# Patient Record
Sex: Male | Born: 1950 | Race: White | Hispanic: No | Marital: Married | Smoking: Former smoker
Health system: Southern US, Community
[De-identification: ages and names within clinical notes are randomized; demographics above are authoritative.]

## PROBLEM LIST (undated history)

## (undated) DIAGNOSIS — N419 Inflammatory disease of prostate, unspecified: Secondary | ICD-10-CM

## (undated) DIAGNOSIS — E119 Type 2 diabetes mellitus without complications: Secondary | ICD-10-CM

## (undated) DIAGNOSIS — I10 Essential (primary) hypertension: Secondary | ICD-10-CM

## (undated) DIAGNOSIS — I350 Nonrheumatic aortic (valve) stenosis: Secondary | ICD-10-CM

## (undated) DIAGNOSIS — K589 Irritable bowel syndrome without diarrhea: Secondary | ICD-10-CM

## (undated) DIAGNOSIS — E871 Hypo-osmolality and hyponatremia: Secondary | ICD-10-CM

## (undated) DIAGNOSIS — I509 Heart failure, unspecified: Secondary | ICD-10-CM

## (undated) HISTORY — PX: CATARACT EXTRACTION: SUR2

## (undated) HISTORY — PX: CHOLECYSTECTOMY: SHX55

## (undated) HISTORY — PX: GALLBLADDER SURGERY: SHX652

---

## 2014-12-01 ENCOUNTER — Inpatient Hospital Stay (HOSPITAL_COMMUNITY)
Admission: EM | Admit: 2014-12-01 | Discharge: 2014-12-03 | DRG: 392 | Disposition: A | Discharge status: Home / Self Care | Payer: Medicare Other | Attending: Internal Medicine | Admitting: Internal Medicine

## 2014-12-01 ENCOUNTER — Emergency Department (HOSPITAL_COMMUNITY): Payer: Medicare Other

## 2014-12-01 ENCOUNTER — Encounter (HOSPITAL_COMMUNITY): Payer: Self-pay | Admitting: Emergency Medicine

## 2014-12-01 DIAGNOSIS — Z8719 Personal history of other diseases of the digestive system: Secondary | ICD-10-CM

## 2014-12-01 DIAGNOSIS — Z9049 Acquired absence of other specified parts of digestive tract: Secondary | ICD-10-CM

## 2014-12-01 DIAGNOSIS — A084 Viral intestinal infection, unspecified: Principal | ICD-10-CM | POA: Diagnosis present

## 2014-12-01 DIAGNOSIS — R112 Nausea with vomiting, unspecified: Secondary | ICD-10-CM | POA: Diagnosis present

## 2014-12-01 DIAGNOSIS — K219 Gastro-esophageal reflux disease without esophagitis: Secondary | ICD-10-CM | POA: Diagnosis present

## 2014-12-01 DIAGNOSIS — E878 Other disorders of electrolyte and fluid balance, not elsewhere classified: Secondary | ICD-10-CM | POA: Diagnosis present

## 2014-12-01 DIAGNOSIS — R197 Diarrhea, unspecified: Secondary | ICD-10-CM

## 2014-12-01 DIAGNOSIS — Z6841 Body Mass Index (BMI) 40.0 and over, adult: Secondary | ICD-10-CM

## 2014-12-01 DIAGNOSIS — E871 Hypo-osmolality and hyponatremia: Secondary | ICD-10-CM | POA: Diagnosis present

## 2014-12-01 DIAGNOSIS — K439 Ventral hernia without obstruction or gangrene: Secondary | ICD-10-CM | POA: Diagnosis present

## 2014-12-01 DIAGNOSIS — Z23 Encounter for immunization: Secondary | ICD-10-CM

## 2014-12-01 DIAGNOSIS — Z833 Family history of diabetes mellitus: Secondary | ICD-10-CM

## 2014-12-01 DIAGNOSIS — I509 Heart failure, unspecified: Secondary | ICD-10-CM

## 2014-12-01 DIAGNOSIS — K589 Irritable bowel syndrome without diarrhea: Secondary | ICD-10-CM | POA: Diagnosis present

## 2014-12-01 DIAGNOSIS — I350 Nonrheumatic aortic (valve) stenosis: Secondary | ICD-10-CM

## 2014-12-01 DIAGNOSIS — Z87891 Personal history of nicotine dependence: Secondary | ICD-10-CM

## 2014-12-01 DIAGNOSIS — R109 Unspecified abdominal pain: Secondary | ICD-10-CM | POA: Diagnosis present

## 2014-12-01 DIAGNOSIS — I5022 Chronic systolic (congestive) heart failure: Secondary | ICD-10-CM | POA: Diagnosis present

## 2014-12-01 DIAGNOSIS — Z79899 Other long term (current) drug therapy: Secondary | ICD-10-CM

## 2014-12-01 DIAGNOSIS — E119 Type 2 diabetes mellitus without complications: Secondary | ICD-10-CM | POA: Diagnosis present

## 2014-12-01 DIAGNOSIS — I2510 Atherosclerotic heart disease of native coronary artery without angina pectoris: Secondary | ICD-10-CM | POA: Diagnosis present

## 2014-12-01 DIAGNOSIS — Z8249 Family history of ischemic heart disease and other diseases of the circulatory system: Secondary | ICD-10-CM

## 2014-12-01 DIAGNOSIS — Z7982 Long term (current) use of aspirin: Secondary | ICD-10-CM

## 2014-12-01 HISTORY — DX: Heart failure, unspecified: I50.9

## 2014-12-01 HISTORY — DX: Nonrheumatic aortic (valve) stenosis: I35.0

## 2014-12-01 HISTORY — DX: Irritable bowel syndrome, unspecified: K58.9

## 2014-12-01 HISTORY — DX: Type 2 diabetes mellitus without complications: E11.9

## 2014-12-01 LAB — COMPREHENSIVE METABOLIC PANEL
ALBUMIN: 3.4 g/dL — AB (ref 3.5–5.0)
ALT: 17 U/L (ref 17–63)
AST: 18 U/L (ref 15–41)
Alkaline Phosphatase: 88 U/L (ref 38–126)
Anion gap: 6 (ref 5–15)
BUN: 13 mg/dL (ref 6–20)
CHLORIDE: 92 mmol/L — AB (ref 101–111)
CO2: 23 mmol/L (ref 22–32)
Calcium: 9.1 mg/dL (ref 8.9–10.3)
Creatinine, Ser: 0.73 mg/dL (ref 0.61–1.24)
GFR calc Af Amer: >60 mL/min (ref >60)
GLUCOSE: 160 mg/dL — AB (ref 65–99)
POTASSIUM: 4.5 mmol/L (ref 3.5–5.1)
SODIUM: 121 mmol/L — AB (ref 135–145)
Total Bilirubin: 0.6 mg/dL (ref 0.3–1.2)
Total Protein: 7.1 g/dL (ref 6.5–8.1)

## 2014-12-01 LAB — CBC
HEMATOCRIT: 34.4 % — AB (ref 39.0–52.0)
Hemoglobin: 12.4 g/dL — ABNORMAL LOW (ref 13.0–17.0)
MCH: 30.2 pg (ref 26.0–34.0)
MCHC: 36 g/dL (ref 30.0–36.0)
MCV: 83.9 fL (ref 78.0–100.0)
Platelets: 210 10*3/uL (ref 150–400)
RBC: 4.1 MIL/uL — ABNORMAL LOW (ref 4.22–5.81)
RDW: 12.8 % (ref 11.5–15.5)
WBC: 8.4 10*3/uL (ref 4.0–10.5)

## 2014-12-01 LAB — LIPASE, BLOOD: LIPASE: 27 U/L (ref 11–51)

## 2014-12-01 MED ORDER — IOHEXOL 300 MG/ML  SOLN
25.0000 mL | Freq: Once | INTRAMUSCULAR | Status: AC | PRN
  Administered 2014-12-01: 25 mL via ORAL

## 2014-12-01 MED ORDER — ONDANSETRON 4 MG PO TBDP
4.0000 mg | ORAL_TABLET | Freq: Once | ORAL | Status: AC | PRN
  Administered 2014-12-01: 4 mg via ORAL
  Filled 2014-12-01: qty 1

## 2014-12-01 MED ORDER — SODIUM CHLORIDE 0.9 % IV BOLUS (SEPSIS)
500.0000 mL | Freq: Once | INTRAVENOUS | Status: AC
  Administered 2014-12-02: 500 mL via INTRAVENOUS

## 2014-12-01 NOTE — ED Provider Notes (Signed)
CSN: [id number]     Arrival date & time 12/01/14  1949 History   First MD Initiated Contact with Patient 12/01/14 2257     Chief Complaint  Patient presents with  . Abdominal Pain    HPI   Kyle Barr is a 64 y.o. male with a PMH of IBS, DM, heart failure, aortic stenosis who presents to the ED with abdominal pain. He states he has had abdominal pain for the past 2 months. He reports frequent stools for the last 5 days, and states he had an episode of diarrhea today. He denies hematochezia or melena. He also reports nausea and vomiting for the last 2 days. He states he had one episode of emesis yesterday and 2 episodes of emesis today. He denies hematemesis. He is unable to identify anything that precipitates his symptoms. He has not tried anything for symptom relief. He denies fever, chills, headache, lightheadedness, dizziness, chest pain, shortness of breath, dysuria, urgency, frequency.   Past Medical History  Diagnosis Date  . IBS (irritable bowel syndrome)     with constipation  . Diabetes mellitus without complication (HCC)   . Heart failure (HCC)   . Aortic stenosis    Past Surgical History  Procedure Laterality Date  . Cataract extraction    . Gallbladder surgery     History reviewed. No pertinent family history. Social History  Substance Use Topics  . Smoking status: Former Games developermoker  . Smokeless tobacco: Never Used  . Alcohol Use: No    Review of Systems  Constitutional: Positive for fatigue. Negative for fever and chills.  Respiratory: Negative for shortness of breath.   Cardiovascular: Negative for chest pain.  Gastrointestinal: Positive for nausea, vomiting, abdominal pain and diarrhea. Negative for constipation and blood in stool.  Genitourinary: Negative for dysuria, urgency and frequency.  Neurological: Positive for weakness. Negative for dizziness, syncope, light-headedness, numbness and headaches.       Reports generalized weakness.  All other systems  reviewed and are negative.     Allergies  Review of patient's allergies indicates no known allergies.  Home Medications   Prior to Admission medications   Medication Sig Start Date End Date Taking? Authorizing Provider  TOUJEO SOLOSTAR 300 UNIT/ML SOPN Inject 20 Units as directed at bedtime. 10/19/14   Historical Provider, MD  TRULICITY 0.75 MG/0.5ML SOPN Inject 0.75 mLs as directed every 7 (seven) days. 09/08/14   Historical Provider, MD    BP 151/77 mmHg  Pulse 87  Temp(Src) 97.6 F (36.4 C) (Oral)  Resp 20  Ht 5\' 10"  (1.778 m)  Wt 290 lb (131.543 kg)  BMI 41.61 kg/m2  SpO2 99% Physical Exam  Constitutional: He is oriented to person, place, and time. No distress.  Obese male in no acute distress.  HENT:  Head: Normocephalic and atraumatic.  Right Ear: External ear normal.  Left Ear: External ear normal.  Nose: Nose normal.  Mouth/Throat: Uvula is midline, oropharynx is clear and moist and mucous membranes are normal.  Eyes: Conjunctivae, EOM and lids are normal. Pupils are equal, round, and reactive to light. Right eye exhibits no discharge. Left eye exhibits no discharge. No scleral icterus.  Neck: Normal range of motion. Neck supple.  Cardiovascular: Normal rate, regular rhythm, intact distal pulses and normal pulses.   3/6 systolic murmur loudest RUSB.  Pulmonary/Chest: Effort normal and breath sounds normal. No respiratory distress. He has no wheezes. He has no rales.  Abdominal: Soft. Normal appearance and bowel sounds are normal. He exhibits  no distension and no mass. There is tenderness. There is no rigidity, no rebound and no guarding.  Tenderness to palpation left lower quadrant. No rebound, guarding, or palpable masses.  Musculoskeletal: Normal range of motion. He exhibits no edema or tenderness.  Neurological: He is alert and oriented to person, place, and time.  Skin: Skin is warm, dry and intact. No rash noted. He is not diaphoretic. No erythema. No pallor.   Psychiatric: He has a normal mood and affect. His speech is normal and behavior is normal.  Nursing note and vitals reviewed.   ED Course  Procedures (including critical care time)  Labs Review Labs Reviewed  COMPREHENSIVE METABOLIC PANEL - Abnormal; Notable for the following:    Sodium 121 (*)    Chloride 92 (*)    Glucose, Bld 160 (*)    Albumin 3.4 (*)    All other components within normal limits  CBC - Abnormal; Notable for the following:    RBC 4.10 (*)    Hemoglobin 12.4 (*)    HCT 34.4 (*)    All other components within normal limits  URINALYSIS, ROUTINE W REFLEX MICROSCOPIC (NOT AT [facility]) - Abnormal; Notable for the following:    Hgb urine dipstick MODERATE (*)    Protein, ur 100 (*)    All other components within normal limits  LIPASE, BLOOD  URINE MICROSCOPIC-ADD ON    Imaging Review Ct Abdomen Pelvis W Contrast  12/02/2014  CLINICAL DATA:  Chronic generalized abdominal pain for 2 months, with nausea, vomiting and diarrhea. Microhematuria. Initial encounter. EXAM: CT ABDOMEN AND PELVIS WITH CONTRAST TECHNIQUE: Multidetector CT imaging of the abdomen and pelvis was performed using the standard protocol following bolus administration of intravenous contrast. CONTRAST:  OMNIPAQUE IOHEXOL 300 MG/ML  SOLN COMPARISON:  None. FINDINGS: The visualized lung bases are clear. Scattered coronary artery calcification is noted. The liver and spleen are unremarkable in appearance. The patient is status post cholecystectomy, with clips noted at the gallbladder fossa. The pancreas and adrenal glands are unremarkable. The kidneys are unremarkable in appearance. There is no evidence of hydronephrosis. No renal or ureteral stones are seen. Mild nonspecific perinephric stranding is noted bilaterally. No free fluid is identified. The small bowel is unremarkable in appearance. The stomach is within normal limits. No acute vascular abnormalities are seen. Scattered calcification is noted  along the abdominal aorta and its branches. A small midline anterior abdominal wall hernia is noted, superior to the umbilicus, containing only fat. The appendix is normal in caliber, without evidence for appendicitis. The colon is unremarkable in appearance. The bladder is moderately distended and grossly unremarkable. The prostate remains borderline normal in size. No inguinal lymphadenopathy is seen. No acute osseous abnormalities are identified. IMPRESSION: 1. No acute abnormality seen to explain the patient's symptoms. 2. Small midline anterior abdominal wall hernia, superior to the umbilicus, containing only fat. 3. Scattered calcification along the abdominal aorta and its branches. 4. Scattered coronary artery calcification noted. Electronically Signed   By: Roanna Raider M.D.   On: 12/02/2014 01:09     I have personally reviewed and evaluated these images and lab results as part of my medical decision-making.   EKG Interpretation None      MDM   Final diagnoses:  Abdominal pain  Non-intractable vomiting with nausea, vomiting of unspecified type  Diarrhea, unspecified type  Hyponatremia    64 year old male presents with abdominal pain, nausea, vomiting, diarrhea. He states his abdominal pain has been present for the  last 2 months. He reports frequent stools over the last 5 days, and diarrhea today. He reports nausea and vomiting for the past 2 days. He denies fever, chills, headache, lightheadedness, dizziness, chest pain, shortness of breath, dysuria, urgency, frequency, hematemesis, hematochezia, melena. He states he is from [place] and is in town on work.  Patient is afebrile. Vital signs stable. Heart regular rate and rhythm. Lungs clear to auscultation bilaterally. Abdomen soft, non-distended. Mild tenderness palpation in left lower quadrant. No rebound, guarding, or palpable masses. No lower extremity edema.  CBC negative for leukocytosis. Hemoglobin 12.4. CMP with sodium 121,  chloride 92. Lipase within normal limits. UA negative for infection. Given 500 cc NS. Patient states his sodium typically runs low, however he is unsure how low.   Will obtain CT abdomen pelvis. CT abdomen pelvis negative for acute abnormality to explain abdominal pain.  Given hyponatremia and hypochloremia, will consult hospitalist for admission. Spoke with Dr. Clyde Lundborg; patient to be admitted for further evaluation and management.   BP 155/77 mmHg  Pulse 83  Temp(Src) 97.6 F (36.4 C) (Oral)  Resp 14  Ht  (1.778 m)  Wt 290 lb (131.543 kg)  BMI 41.61 kg/m2  SpO2 99%      Mady Gemma, PA-C 12/02/14 0149  Loren Racer, MD 12/03/14 [phone]

## 2014-12-01 NOTE — ED Notes (Signed)
Patient is having abdominal pain. Abdominal pain has been for 2 months. The vomiting and diarrhea for 5 days. [place] on [place]. Patient is having muscle pain all over. Patient can not hold anything down. Patient has a history of IBS.

## 2014-12-02 ENCOUNTER — Encounter (HOSPITAL_COMMUNITY): Payer: Self-pay

## 2014-12-02 DIAGNOSIS — R109 Unspecified abdominal pain: Secondary | ICD-10-CM | POA: Diagnosis present

## 2014-12-02 DIAGNOSIS — R112 Nausea with vomiting, unspecified: Secondary | ICD-10-CM | POA: Diagnosis present

## 2014-12-02 DIAGNOSIS — Z7982 Long term (current) use of aspirin: Secondary | ICD-10-CM | POA: Diagnosis not present

## 2014-12-02 DIAGNOSIS — K219 Gastro-esophageal reflux disease without esophagitis: Secondary | ICD-10-CM | POA: Diagnosis present

## 2014-12-02 DIAGNOSIS — R197 Diarrhea, unspecified: Secondary | ICD-10-CM | POA: Diagnosis not present

## 2014-12-02 DIAGNOSIS — E871 Hypo-osmolality and hyponatremia: Secondary | ICD-10-CM | POA: Diagnosis present

## 2014-12-02 DIAGNOSIS — A084 Viral intestinal infection, unspecified: Secondary | ICD-10-CM | POA: Diagnosis present

## 2014-12-02 DIAGNOSIS — E119 Type 2 diabetes mellitus without complications: Secondary | ICD-10-CM

## 2014-12-02 DIAGNOSIS — K589 Irritable bowel syndrome without diarrhea: Secondary | ICD-10-CM | POA: Diagnosis present

## 2014-12-02 DIAGNOSIS — Z23 Encounter for immunization: Secondary | ICD-10-CM | POA: Diagnosis not present

## 2014-12-02 DIAGNOSIS — I509 Heart failure, unspecified: Secondary | ICD-10-CM

## 2014-12-02 DIAGNOSIS — I350 Nonrheumatic aortic (valve) stenosis: Secondary | ICD-10-CM | POA: Diagnosis present

## 2014-12-02 DIAGNOSIS — Z8249 Family history of ischemic heart disease and other diseases of the circulatory system: Secondary | ICD-10-CM | POA: Diagnosis not present

## 2014-12-02 DIAGNOSIS — E878 Other disorders of electrolyte and fluid balance, not elsewhere classified: Secondary | ICD-10-CM | POA: Diagnosis present

## 2014-12-02 DIAGNOSIS — Z833 Family history of diabetes mellitus: Secondary | ICD-10-CM | POA: Diagnosis not present

## 2014-12-02 DIAGNOSIS — K439 Ventral hernia without obstruction or gangrene: Secondary | ICD-10-CM | POA: Diagnosis present

## 2014-12-02 DIAGNOSIS — Z79899 Other long term (current) drug therapy: Secondary | ICD-10-CM | POA: Diagnosis not present

## 2014-12-02 DIAGNOSIS — Z87891 Personal history of nicotine dependence: Secondary | ICD-10-CM | POA: Diagnosis not present

## 2014-12-02 DIAGNOSIS — Z6841 Body Mass Index (BMI) 40.0 and over, adult: Secondary | ICD-10-CM | POA: Diagnosis not present

## 2014-12-02 DIAGNOSIS — I5022 Chronic systolic (congestive) heart failure: Secondary | ICD-10-CM | POA: Diagnosis present

## 2014-12-02 DIAGNOSIS — Z8719 Personal history of other diseases of the digestive system: Secondary | ICD-10-CM | POA: Diagnosis not present

## 2014-12-02 DIAGNOSIS — I2510 Atherosclerotic heart disease of native coronary artery without angina pectoris: Secondary | ICD-10-CM | POA: Diagnosis present

## 2014-12-02 DIAGNOSIS — Z9049 Acquired absence of other specified parts of digestive tract: Secondary | ICD-10-CM | POA: Diagnosis not present

## 2014-12-02 DIAGNOSIS — R1032 Left lower quadrant pain: Secondary | ICD-10-CM | POA: Diagnosis not present

## 2014-12-02 LAB — BASIC METABOLIC PANEL
Anion gap: 7 (ref 5–15)
BUN: 10 mg/dL (ref 6–20)
CALCIUM: 9 mg/dL (ref 8.9–10.3)
CO2: 24 mmol/L (ref 22–32)
CREATININE: 0.73 mg/dL (ref 0.61–1.24)
Chloride: 94 mmol/L — ABNORMAL LOW (ref 101–111)
GFR calc Af Amer: >60 mL/min (ref >60)
GLUCOSE: 143 mg/dL — AB (ref 65–99)
Potassium: 4.2 mmol/L (ref 3.5–5.1)
SODIUM: 125 mmol/L — AB (ref 135–145)

## 2014-12-02 LAB — URINE MICROSCOPIC-ADD ON

## 2014-12-02 LAB — CBC
HCT: 34 % — ABNORMAL LOW (ref 39.0–52.0)
HEMOGLOBIN: 12.1 g/dL — AB (ref 13.0–17.0)
MCH: 30.3 pg (ref 26.0–34.0)
MCHC: 35.6 g/dL (ref 30.0–36.0)
MCV: 85 fL (ref 78.0–100.0)
PLATELETS: 211 10*3/uL (ref 150–400)
RBC: 4 MIL/uL — AB (ref 4.22–5.81)
RDW: 12.7 % (ref 11.5–15.5)
WBC: 6.4 10*3/uL (ref 4.0–10.5)

## 2014-12-02 LAB — SODIUM, URINE, RANDOM: Sodium, Ur: 13 mmol/L

## 2014-12-02 LAB — GLUCOSE, CAPILLARY
Glucose-Capillary: 194 mg/dL — ABNORMAL HIGH (ref 65–99)
Glucose-Capillary: 333 mg/dL — ABNORMAL HIGH (ref 65–99)
Glucose-Capillary: 246 mg/dL — ABNORMAL HIGH (ref 65–99)
Glucose-Capillary: 319 mg/dL — ABNORMAL HIGH (ref 65–99)

## 2014-12-02 LAB — URINALYSIS, ROUTINE W REFLEX MICROSCOPIC
Bilirubin Urine: NEGATIVE
GLUCOSE, UA: NEGATIVE mg/dL
KETONES UR: NEGATIVE mg/dL
LEUKOCYTES UA: NEGATIVE
NITRITE: NEGATIVE
PROTEIN: 100 mg/dL — AB
Specific Gravity, Urine: 1.011 (ref 1.005–1.030)
Urobilinogen, UA: 0.2 mg/dL (ref 0.0–1.0)
pH: 5.5 (ref 5.0–8.0)

## 2014-12-02 LAB — C DIFFICILE QUICK SCREEN W PCR REFLEX
C DIFFICILE (CDIFF) TOXIN: NEGATIVE
C DIFFICLE (CDIFF) ANTIGEN: NEGATIVE
C Diff interpretation: NEGATIVE

## 2014-12-02 LAB — BRAIN NATRIURETIC PEPTIDE: B Natriuretic Peptide: 379.7 pg/mL — ABNORMAL HIGH (ref 0.0–100.0)

## 2014-12-02 LAB — TSH: TSH: 1.99 u[IU]/mL (ref 0.350–4.500)

## 2014-12-02 LAB — PROTIME-INR
INR: 1.02 (ref 0.00–1.49)
Prothrombin Time: 13.6 seconds (ref 11.6–15.2)

## 2014-12-02 LAB — OSMOLALITY, URINE: Osmolality, Ur: 118 mOsm/kg — ABNORMAL LOW (ref 390–1090)

## 2014-12-02 MED ORDER — INSULIN GLARGINE 300 UNIT/ML ~~LOC~~ SOPN: 20.0000 [IU] | PEN_INJECTOR | Freq: Every day | SUBCUTANEOUS | Status: DC

## 2014-12-02 MED ORDER — PNEUMOCOCCAL VAC POLYVALENT 25 MCG/0.5ML IJ INJ
0.5000 mL | INJECTION | INTRAMUSCULAR | Status: AC
  Administered 2014-12-03: 0.5 mL via INTRAMUSCULAR
  Filled 2014-12-02 (×2): qty 0.5

## 2014-12-02 MED ORDER — SIMETHICONE 80 MG PO CHEW
80.0000 mg | CHEWABLE_TABLET | Freq: Four times a day (QID) | ORAL | Status: DC | PRN
  Administered 2014-12-02 – 2014-12-03 (×2): 80 mg via ORAL
  Filled 2014-12-02 (×2): qty 1

## 2014-12-02 MED ORDER — FAMOTIDINE IN NACL 20-0.9 MG/50ML-% IV SOLN
20.0000 mg | Freq: Two times a day (BID) | INTRAVENOUS | Status: DC
  Administered 2014-12-02 (×2): 20 mg via INTRAVENOUS
  Filled 2014-12-02 (×2): qty 50

## 2014-12-02 MED ORDER — MORPHINE SULFATE (PF) 2 MG/ML IV SOLN
2.0000 mg | INTRAVENOUS | Status: DC | PRN
Start: 2014-12-02 — End: 2014-12-03

## 2014-12-02 MED ORDER — ALFUZOSIN HCL ER 10 MG PO TB24
10.0000 mg | ORAL_TABLET | Freq: Every day | ORAL | Status: DC
  Administered 2014-12-02: 10 mg via ORAL
  Filled 2014-12-02 (×2): qty 1

## 2014-12-02 MED ORDER — ENOXAPARIN SODIUM 80 MG/0.8ML ~~LOC~~ SOLN
65.0000 mg | SUBCUTANEOUS | Status: DC
  Administered 2014-12-02: 65 mg via SUBCUTANEOUS
  Filled 2014-12-02: qty 0.8

## 2014-12-02 MED ORDER — INSULIN GLARGINE 100 UNIT/ML ~~LOC~~ SOLN
20.0000 [IU] | Freq: Every day | SUBCUTANEOUS | Status: DC
  Administered 2014-12-02: 20 [IU] via SUBCUTANEOUS
  Filled 2014-12-02: qty 0.2

## 2014-12-02 MED ORDER — LINACLOTIDE 290 MCG PO CAPS
290.0000 ug | ORAL_CAPSULE | Freq: Every day | ORAL | Status: DC
  Filled 2014-12-02 (×2): qty 1

## 2014-12-02 MED ORDER — HYDRALAZINE HCL 20 MG/ML IJ SOLN: 5.0000 mg | INTRAMUSCULAR | Status: DC | PRN

## 2014-12-02 MED ORDER — FAMOTIDINE 20 MG PO TABS
20.0000 mg | ORAL_TABLET | Freq: Two times a day (BID) | ORAL | Status: DC
  Administered 2014-12-02 – 2014-12-03 (×2): 20 mg via ORAL
  Filled 2014-12-02 (×2): qty 1

## 2014-12-02 MED ORDER — CAMPHOR-MENTHOL 0.5-0.5 % EX LOTN
TOPICAL_LOTION | CUTANEOUS | Status: DC | PRN
  Administered 2014-12-02: 1 via TOPICAL
  Filled 2014-12-02: qty 222

## 2014-12-02 MED ORDER — HYDROXYZINE HCL 50 MG/ML IM SOLN
25.0000 mg | Freq: Four times a day (QID) | INTRAMUSCULAR | Status: DC | PRN
  Filled 2014-12-02 (×2): qty 0.5

## 2014-12-02 MED ORDER — CARVEDILOL 25 MG PO TABS
25.0000 mg | ORAL_TABLET | Freq: Two times a day (BID) | ORAL | Status: DC
  Administered 2014-12-02 (×2): 25 mg via ORAL
  Filled 2014-12-02 (×3): qty 1

## 2014-12-02 MED ORDER — ONDANSETRON 4 MG PO TBDP
4.0000 mg | ORAL_TABLET | Freq: Three times a day (TID) | ORAL | Status: DC | PRN
  Administered 2014-12-02: 4 mg via ORAL
  Filled 2014-12-02: qty 1

## 2014-12-02 MED ORDER — SODIUM CHLORIDE 0.9 % IV SOLN
INTRAVENOUS | Status: AC
  Administered 2014-12-02: 03:00:00 via INTRAVENOUS

## 2014-12-02 MED ORDER — INFLUENZA VAC SPLIT QUAD 0.5 ML IM SUSY
0.5000 mL | PREFILLED_SYRINGE | INTRAMUSCULAR | Status: AC
  Administered 2014-12-03: 0.5 mL via INTRAMUSCULAR
  Filled 2014-12-02 (×2): qty 0.5

## 2014-12-02 MED ORDER — IOHEXOL 300 MG/ML  SOLN
100.0000 mL | Freq: Once | INTRAMUSCULAR | Status: AC | PRN
  Administered 2014-12-02: 100 mL via INTRAVENOUS

## 2014-12-02 MED ORDER — ENOXAPARIN SODIUM 60 MG/0.6ML ~~LOC~~ SOLN
60.0000 mg | SUBCUTANEOUS | Status: DC
  Administered 2014-12-03: 60 mg via SUBCUTANEOUS
  Filled 2014-12-02: qty 0.6

## 2014-12-02 MED ORDER — NYSTATIN 100000 UNIT/GM EX POWD
Freq: Two times a day (BID) | CUTANEOUS | Status: DC
  Administered 2014-12-02 – 2014-12-03 (×2): via TOPICAL
  Filled 2014-12-02: qty 15

## 2014-12-02 MED ORDER — ASPIRIN EC 81 MG PO TBEC
81.0000 mg | DELAYED_RELEASE_TABLET | Freq: Every day | ORAL | Status: DC
  Administered 2014-12-02: 81 mg via ORAL
  Filled 2014-12-02 (×2): qty 1

## 2014-12-02 MED ORDER — SODIUM CHLORIDE 0.9 % IV SOLN
INTRAVENOUS | Status: DC
  Administered 2014-12-02: 17:00:00 via INTRAVENOUS

## 2014-12-02 MED ORDER — SODIUM CHLORIDE 0.9 % IJ SOLN
3.0000 mL | Freq: Two times a day (BID) | INTRAMUSCULAR | Status: DC
  Administered 2014-12-02 – 2014-12-03 (×4): 3 mL via INTRAVENOUS

## 2014-12-02 MED ORDER — INSULIN ASPART 100 UNIT/ML ~~LOC~~ SOLN
0.0000 [IU] | Freq: Three times a day (TID) | SUBCUTANEOUS | Status: DC
  Administered 2014-12-02: 2 [IU] via SUBCUTANEOUS
  Administered 2014-12-02: 3 [IU] via SUBCUTANEOUS
  Administered 2014-12-02: 7 [IU] via SUBCUTANEOUS
  Administered 2014-12-03: 5 [IU] via SUBCUTANEOUS

## 2014-12-02 NOTE — H&P (Addendum)
[facility] History and Physical  Kyle Barr ZOX:[id number] DOB: 11-26-1950 DOA: 12/01/2014  Referring physician: ED physician PCP: No primary care provider on file.  Specialists:   Chief Complaint: Nausea, vomiting, diarrhea, abdominal pain.  HPI: Kyle Barr is a 64 y.o. male with PMH of diabetes mellitus, GERD, IBS, aortic stenosis, systolic congestive heart failure (EF of 40%), BPH, who presents with nausea, vomiting, diarrhea, abdominal pain.  Patient reports that he has been having intermittent abdominal pain in the past 2 months. The pain located in the left lower quadrant, sometimes involving the whole left side. It is mild to moderate, sharp, nonradiating. It is not aggravated or alleviated any known factors. It is associated with nausea. Initially he did not have vomiting or diarrhea. In the past 2 or 3 days, he has been having vomiting and diarrhea. He vomited 1 to 3 times each day and had 4-5 bowel movements with loose stool today. Patient reports that he took antibiotics of ciprofloxacin and doxycycline for 6 weeks for neck skin infection at 2-3 months ago (he could not remember exactly how long ago). Patient has chronic shortness of breath and mild dry cough, which he attributes to congestive heart failure. Currently patient does not have chest pain, fever, chills, symptoms of UTI, unilateral weakness. Patient states that he did not take his Lasix and spironolactone in the past 3 days due to concerning for dehydration.  In ED, patient was found to have lipase 27, negative urinalysis, WBC 8.4, temperature normal, no tachycardia, sodium 121, potassium normal, renal function okay. CT-abdomen/pelvis that showed no acute abnormality seen to explain the patient's symptoms; small midline anterior abdominal wall hernia, superior to the umbilicus, containing only fat; scattered calcification along the abdominal aorta and its branches; scattered coronary artery calcification  noted.  Where does patient live?   At home (patient is from [place], on business here). Can patient participate in ADLs?  Yes     Review of Systems:   General: no fevers, chills, no changes in body weight, has poor appetite, has fatigue HEENT: no blurry vision, hearing changes or sore throat Pulm: has dyspnea, coughing, no wheezing CV: no chest pain, palpitations Abd: has nausea, vomiting, abdominal pain, diarrhea, no constipation GU: no dysuria, burning on urination, increased urinary frequency, hematuria  Ext: has leg edema Neuro: no unilateral weakness, numbness, or tingling, no vision change or hearing loss Skin: no rash MSK: No muscle spasm, no deformity, no limitation of range of movement in spin Heme: No easy bruising.  Travel history: No recent long distant travel.  Allergy: No Known Allergies  Past Medical History  Diagnosis Date  . IBS (irritable bowel syndrome)     with constipation  . Diabetes mellitus without complication (HCC)   . Heart failure (HCC)   . Aortic stenosis     Past Surgical History  Procedure Laterality Date  . Cataract extraction    . Gallbladder surgery      Social History:  reports that he has quit smoking. He has never used smokeless tobacco. He reports that he does not drink alcohol or use illicit drugs.  Family History:  Family History  Problem Relation Age of Onset  . Diabetes Mother   . Aortic stenosis Mother   . CAD Mother   . Congestive Heart Failure Father   . Diabetes Father      Prior to Admission medications   Medication Sig Start Date End Date Taking? Authorizing Provider  alfuzosin (UROXATRAL) 10 MG 24 hr tablet  Take 10 mg by mouth daily with breakfast.   Yes Historical Provider, MD  amoxicillin (AMOXIL) 875 MG tablet Take 875 mg by mouth 2 (two) times daily.   Yes Historical Provider, MD  aspirin EC 81 MG tablet Take 81 mg by mouth daily.   Yes Historical Provider, MD  carvedilol (COREG) 25 MG tablet Take 25 mg by  mouth 2 (two) times daily with a meal.   Yes Historical Provider, MD  dexlansoprazole (DEXILANT) 60 MG capsule Take 60 mg by mouth daily.   Yes Historical Provider, MD  furosemide (LASIX) 20 MG tablet Take 20 mg by mouth 2 (two) times daily.   Yes Historical Provider, MD  Linaclotide (LINZESS) 290 MCG CAPS capsule Take 290 mcg by mouth daily.   Yes Historical Provider, MD  metFORMIN (GLUCOPHAGE) 1000 MG tablet Take 1,000 mg by mouth 2 (two) times daily with a meal.   Yes Historical Provider, MD  omeprazole (PRILOSEC) 40 MG capsule Take 40 mg by mouth daily.   Yes Historical Provider, MD  repaglinide (PRANDIN) 2 MG tablet Take 2 mg by mouth 3 (three) times daily before meals.   Yes Historical Provider, MD  simethicone (MYLICON) 125 MG chewable tablet Chew 125 mg by mouth every 6 (six) hours as needed for flatulence.   Yes Historical Provider, MD  spironolactone (ALDACTONE) 25 MG tablet Take 25 mg by mouth daily.   Yes Historical Provider, MD  TOUJEO SOLOSTAR 300 UNIT/ML SOPN Inject 20 Units as directed at bedtime. 10/19/14  Yes Historical Provider, MD  TRULICITY 0.75 MG/0.5ML SOPN Inject 0.75 mg as directed every 7 (seven) days.  09/08/14  Yes Historical Provider, MD    Physical Exam: Filed Vitals:   12/01/14 2255 12/01/14 2350 12/02/14 0211 12/02/14 0232  BP: 151/77 155/77 130/67   Pulse: 87 83 87   Temp: 97.6 F (36.4 C)     TempSrc: Oral     Resp: 20 14 14    Height:    5\' 10"  (1.778 m)  Weight:    134.219 kg (295 lb 14.4 oz)  SpO2: 99% 99% 99%    General: Not in acute distress. Dry mucus and membrane. HEENT:       Eyes: PERRL, EOMI, no scleral icterus.       ENT: No discharge from the ears and nose, no pharynx injection, no tonsillar enlargement.        Neck: No JVD, no bruit, no mass felt. Heme: No neck lymph node enlargement. Cardiac: S1/S2, RRR, 2/6 systolic murmurs, No gallops or rubs. Pulm: No rales, wheezing, rhonchi or rubs. Abd: Soft, nondistended, tenderness diffusely, worse  on the LLQ, no rebound pain, no organomegaly, BS present. Ext: Trace pitting leg edema bilaterally. 2+DP/PT pulse bilaterally. Musculoskeletal: No joint deformities, No joint redness or warmth, no limitation of ROM in spin. Skin: No rashes.  Neuro: Alert, oriented X3, cranial nerves II-XII grossly intact, muscle strength 5/5 in all extremities, sensation to light touch intact.  Psych: Patient is not psychotic, no suicidal or hemocidal ideation.  Labs on Admission:  Basic Metabolic Panel:  Recent Labs Lab 12/01/14 2109  NA 121*  K 4.5  CL 92*  CO2 23  GLUCOSE 160*  BUN 13  CREATININE 0.73  CALCIUM 9.1   Liver Function Tests:  Recent Labs Lab 12/01/14 2109  AST 18  ALT 17  ALKPHOS 88  BILITOT 0.6  PROT 7.1  ALBUMIN 3.4*    Recent Labs Lab 12/01/14 2109  LIPASE 27   No  results for input(s): AMMONIA in the last 168 hours. CBC:  Recent Labs Lab 12/01/14 2109  WBC 8.4  HGB 12.4*  HCT 34.4*  MCV 83.9  PLT 210   Cardiac Enzymes: No results for input(s): CKTOTAL, CKMB, CKMBINDEX, TROPONINI in the last 168 hours.  BNP (last 3 results) No results for input(s): BNP in the last 8760 hours.  ProBNP (last 3 results) No results for input(s): PROBNP in the last 8760 hours.  CBG: No results for input(s): GLUCAP in the last 168 hours.  Radiological Exams on Admission: Ct Abdomen Pelvis W Contrast  12/02/2014  CLINICAL DATA:  Chronic generalized abdominal pain for 2 months, with nausea, vomiting and diarrhea. Microhematuria. Initial encounter. EXAM: CT ABDOMEN AND PELVIS WITH CONTRAST TECHNIQUE: Multidetector CT imaging of the abdomen and pelvis was performed using the standard protocol following bolus administration of intravenous contrast. CONTRAST:  OMNIPAQUE IOHEXOL 300 MG/ML  SOLN COMPARISON:  None. FINDINGS: The visualized lung bases are clear. Scattered coronary artery calcification is noted. The liver and spleen are unremarkable in appearance. The patient  is status post cholecystectomy, with clips noted at the gallbladder fossa. The pancreas and adrenal glands are unremarkable. The kidneys are unremarkable in appearance. There is no evidence of hydronephrosis. No renal or ureteral stones are seen. Mild nonspecific perinephric stranding is noted bilaterally. No free fluid is identified. The small bowel is unremarkable in appearance. The stomach is within normal limits. No acute vascular abnormalities are seen. Scattered calcification is noted along the abdominal aorta and its branches. A small midline anterior abdominal wall hernia is noted, superior to the umbilicus, containing only fat. The appendix is normal in caliber, without evidence for appendicitis. The colon is unremarkable in appearance. The bladder is moderately distended and grossly unremarkable. The prostate remains borderline normal in size. No inguinal lymphadenopathy is seen. No acute osseous abnormalities are identified. IMPRESSION: 1. No acute abnormality seen to explain the patient's symptoms. 2. Small midline anterior abdominal wall hernia, superior to the umbilicus, containing only fat. 3. Scattered calcification along the abdominal aorta and its branches. 4. Scattered coronary artery calcification noted. Electronically Signed   By: Roanna Raider M.D.   On: 12/02/2014 01:09    EKG: Independently reviewed.  Abnormal findings:  QTC 477, bifascicular block (no old EKG on record)  Assessment/Plan Principal Problem:   Nausea vomiting and diarrhea Active Problems:   Diabetes mellitus without complication (HCC)   Heart failure (HCC)   Aortic stenosis   Hyponatremia   Abdominal pain   Chronic systolic heart failure (HCC)   GERD (gastroesophageal reflux disease)   Nausea vomiting and diarrhea and AP: Etiology is not clear. Lipase negative. CT-abdomen/pelvis is not impressive. He has a history of IBS, which may have partially contributed. Since patient used antibiotics for 6 weeks in the  past 2-3 month, need to r/o C. difficile colitis. Patient is not septic on admission. Hemodynamically stable.  -will admit to tele bed -When necessary Hydroxyzine for nausea, morphine for pain -Gentle IV fluid -check C diff pcr and stool culture  GERD: -change PPI to Pepcid until C. difficile PCR negative  DM-II: Last A1c is not on record. Patient is taking Toujeo, Prandin, metformin and Trulicity at home -Continue Toujeo, 20 units daily   -SSI -Check A1c  Chronic systolic congestive heart failure: Per patient he had TEE last month, which showed EF of 40%. Due to nausea, vomiting and diarrhea, patient has not taken Lasix in the The past 3 days. Patient has  trace amount of leg edema, no signs of acute exacerbation of CHF. -Hold diuretics -Check BNP  Aortic stenosis: Patient states that he had TEE for the evaluation of aortic stenosis, planning to do aortic valve replacement in the near future.  Hyponatremia: Sodium 121. Not sure how acutely this has happened, but is likely chronic issue due to CHF and use of diuretics, which has worsened by nausea, vomiting and diarrhea. Since patient's mental status is normal, I will not treat this too agressively. -Received NS 500cc in ED. -will give NS 75 cc/h x 6 hour -repeat BMP in AM -check plasma and urine Osmo and urine sodium level -check TSH   DVT ppx: SQ Lovenox  Code Status: Full code Family Communication: None at bed side. Disposition Plan: Admit to inpatient   Date of Service 12/02/2014    Lorretta Harp [facility] Pager [phone]  If 7PM-7AM, please contact night-coverage [email or web address] Password [email or web address] 12/02/2014, 2:56 AM

## 2014-12-02 NOTE — ED Notes (Signed)
MD at bedside. 

## 2014-12-02 NOTE — Progress Notes (Signed)
Pt admitted after midnight for evaluation of diarrhea. Improving, please see earlier note by Dr. Clyde LundborgNiu. Pt wants to go home but stool studies still pending and Na level still on low side. Continue IVF, repeat BMP in AM. Possible d/c in AM.  Debbora PrestoMAGICK-Kenidee Cregan, MD  [facility] Pager [phone]  If 7PM-7AM, please contact night-coverage [email or web address] Password [email or web address]

## 2014-12-02 NOTE — Progress Notes (Signed)
Pt had 22 beat of what appears to be SV Tach on the tele monitor. Text paged Dr. Izola PriceMyers informing her of same.  Delford FieldGagliano, Selah Zelman E, RN

## 2014-12-02 NOTE — Progress Notes (Signed)
OT Cancellation Note  Patient Details Name: Renato BattlesManuel Slone MRN: [id number] DOB: 08/07/1950   Cancelled Treatment:    Reason Eval/Treat Not Completed: OT screened, no needs identified, will sign off.  Angelene GiovanniConarpe, Corwin Kuiken M  Arslan Kier Brysononarpe, OTR/L [phone]  12/02/2014, 10:39 AM

## 2014-12-02 NOTE — Progress Notes (Signed)
PT Cancellation Note  Patient Details Name: Kyle Barr MRN: [id number] DOB: 05/29/1950   Cancelled Treatment:    Reason Eval/Treat Not Completed: PT screened, no needs identified, will sign off;OT screened, no needs identified, will sign off Pt denies any therapy needs at this time.  Pt just up to bathroom with RN who reports steady gait, no issues observed.  Pt reports feeling better today and denies any needs.   Dariona Postma,KATHrine E 12/02/2014, 9:31 AM Zenovia JarredKati Atiana Levier, PT, DPT 12/02/2014 Pager: [phone]

## 2014-12-03 DIAGNOSIS — A084 Viral intestinal infection, unspecified: Secondary | ICD-10-CM | POA: Diagnosis not present

## 2014-12-03 DIAGNOSIS — I509 Heart failure, unspecified: Secondary | ICD-10-CM | POA: Insufficient documentation

## 2014-12-03 LAB — CBC
HCT: 33.2 % — ABNORMAL LOW (ref 39.0–52.0)
HEMOGLOBIN: 11.5 g/dL — AB (ref 13.0–17.0)
MCH: 30 pg (ref 26.0–34.0)
MCHC: 34.6 g/dL (ref 30.0–36.0)
MCV: 86.7 fL (ref 78.0–100.0)
PLATELETS: 197 10*3/uL (ref 150–400)
RBC: 3.83 MIL/uL — ABNORMAL LOW (ref 4.22–5.81)
RDW: 13.1 % (ref 11.5–15.5)
WBC: 5.2 10*3/uL (ref 4.0–10.5)

## 2014-12-03 LAB — BASIC METABOLIC PANEL
Anion gap: 6 (ref 5–15)
BUN: 12 mg/dL (ref 6–20)
CALCIUM: 9 mg/dL (ref 8.9–10.3)
CO2: 24 mmol/L (ref 22–32)
CREATININE: 1.05 mg/dL (ref 0.61–1.24)
Chloride: 100 mmol/L — ABNORMAL LOW (ref 101–111)
Glucose, Bld: 212 mg/dL — ABNORMAL HIGH (ref 65–99)
Potassium: 4.8 mmol/L (ref 3.5–5.1)
Sodium: 130 mmol/L — ABNORMAL LOW (ref 135–145)

## 2014-12-03 LAB — HEMOGLOBIN A1C
Hgb A1c MFr Bld: 8.4 % — ABNORMAL HIGH (ref 4.8–5.6)
Mean Plasma Glucose: 194 mg/dL

## 2014-12-03 LAB — OSMOLALITY: Osmolality: 283 mOsm/kg (ref 275–300)

## 2014-12-03 LAB — GLUCOSE, CAPILLARY: GLUCOSE-CAPILLARY: 269 mg/dL — AB (ref 65–99)

## 2014-12-03 NOTE — Discharge Summary (Signed)
Physician Discharge Summary  Kyle Barr YQM:[id number] DOB: 1950-04-30 DOA: 12/01/2014  PCP: No primary care provider on file.  Admit date: 12/01/2014 Discharge date: 12/03/2014  Recommendations for Outpatient Follow-up:  1. Pt will need to follow up with PCP in 2-3 weeks post discharge 2. Please obtain BMP to evaluate electrolytes and kidney function  Discharge Diagnoses:  Principal Problem:   Nausea vomiting and diarrhea Active Problems:   Diabetes mellitus without complication (HCC)   Heart failure (HCC)   Aortic stenosis   Hyponatremia   Abdominal pain   Chronic systolic heart failure (HCC)   GERD (gastroesophageal reflux disease)    Discharge Condition: Stable  Diet recommendation: Heart healthy diet discussed in details   History of present illness:  64 y.o. male with PMH of diabetes mellitus, GERD, IBS, aortic stenosis, systolic congestive heart failure (EF of 40%), BPH, who presents with nausea, vomiting, diarrhea, abdominal pain.  Patient reported intermittent abdominal pain in the past 2 months, left lower quadrant, sometimes involving the whole left side. It is mild to moderate, sharp, nonradiating. associated with nausea. vomiting and diarrhea. He denied chest pain, fever, chills, symptoms of UTI, unilateral weakness. Patient states that he did not take his Lasix and spironolactone in the past 3 days due to concern for dehydration.  In ED, patient was found to have lipase 27, negative urinalysis, WBC 8.4, temperature normal, no tachycardia, sodium 121, potassium normal, renal function okay. CT-abdomen/pelvis that showed no acute abnormality seen to explain the patient's symptoms; small midline anterior abdominal wall hernia, superior to the umbilicus, containing only fat; scattered calcification along the abdominal aorta and its branches; scattered coronary artery calcification noted.  Hospital Course:  Principal Problem:   Nausea vomiting and diarrhea, abd  pain  - unclear etiology, ? Viral gastroenteritis  - resolved - pt wants to go home, tolerating diet well  - stool studies so far negative   Active Problems:   Diabetes mellitus without complication (HCC) - stable inpatient     Heart failure (HCC)m chronic diastolic  - no signs of volume overload while inpatient     Hyponatremia - pre renal in etiology - improved significantly with IVF    Morbid obesity - Body mass index is 42.47 kg/(m^2).     Procedures/Studies: Ct Abdomen Pelvis W Contrast  12/02/2014  CLINICAL DATA:  Chronic generalized abdominal pain for 2 months, with nausea, vomiting and diarrhea. Microhematuria. Initial encounter. EXAM: CT ABDOMEN AND PELVIS WITH CONTRAST TECHNIQUE: Multidetector CT imaging of the abdomen and pelvis was performed using the standard protocol following bolus administration of intravenous contrast. CONTRAST:  OMNIPAQUE IOHEXOL 300 MG/ML  SOLN COMPARISON:  None. FINDINGS: The visualized lung bases are clear. Scattered coronary artery calcification is noted. The liver and spleen are unremarkable in appearance. The patient is status post cholecystectomy, with clips noted at the gallbladder fossa. The pancreas and adrenal glands are unremarkable. The kidneys are unremarkable in appearance. There is no evidence of hydronephrosis. No renal or ureteral stones are seen. Mild nonspecific perinephric stranding is noted bilaterally. No free fluid is identified. The small bowel is unremarkable in appearance. The stomach is within normal limits. No acute vascular abnormalities are seen. Scattered calcification is noted along the abdominal aorta and its branches. A small midline anterior abdominal wall hernia is noted, superior to the umbilicus, containing only fat. The appendix is normal in caliber, without evidence for appendicitis. The colon is unremarkable in appearance. The bladder is moderately distended and grossly unremarkable. The prostate  remains  borderline normal in size. No inguinal lymphadenopathy is seen. No acute osseous abnormalities are identified. IMPRESSION: 1. No acute abnormality seen to explain the patient's symptoms. 2. Small midline anterior abdominal wall hernia, superior to the umbilicus, containing only fat. 3. Scattered calcification along the abdominal aorta and its branches. 4. Scattered coronary artery calcification noted. Electronically Signed   By: Roanna Raider M.D.   On: 12/02/2014 01:09    Discharge Exam: Filed Vitals:   12/03/14 0549  BP: 125/72  Pulse: 85  Temp: 97.5 F (36.4 C)  Resp: 18   Filed Vitals:   12/02/14 0700 12/02/14 1349 12/02/14 2201 12/03/14 0549  BP: 142/64 122/73 122/67 125/72  Pulse: 94 89 92 85  Temp: 97.7 F (36.5 C) 98.5 F (36.9 C) 98.1 F (36.7 C) 97.5 F (36.4 C)  TempSrc: Oral Oral Oral Oral  Resp: Height:      Weight:    134.265 kg (296 lb)  SpO2: 100% 100% 100% 100%    General: Pt is alert, follows commands appropriately, not in acute distress Cardiovascular: Regular rate and rhythm, S1/S2 +, no murmurs, no rubs, no gallops Respiratory: Clear to auscultation bilaterally, no wheezing, no crackles, no rhonchi Abdominal: Soft, non tender, non distended, bowel sounds +, no guarding Extremities: no edema, no cyanosis, pulses palpable bilaterally DP and PT Neuro: Grossly nonfocal  Discharge Instructions  Discharge Instructions    Diet - low sodium heart healthy    Complete by:  As directed      Increase activity slowly    Complete by:  As directed             Medication List    STOP taking these medications        amoxicillin 875 MG tablet  Commonly known as:  AMOXIL      TAKE these medications        alfuzosin 10 MG 24 hr tablet  Commonly known as:  UROXATRAL  Take 10 mg by mouth daily with breakfast.     aspirin EC 81 MG tablet  Take 81 mg by mouth daily.     carvedilol 25 MG tablet  Commonly known as:  COREG  Take 25 mg by mouth  2 (two) times daily with a meal.     DEXILANT 60 MG capsule  Generic drug:  dexlansoprazole  Take 60 mg by mouth daily.     furosemide 20 MG tablet  Commonly known as:  LASIX  Take 20 mg by mouth 2 (two) times daily.     Linaclotide 290 MCG Caps capsule  Commonly known as:  LINZESS  Take 290 mcg by mouth daily.     metFORMIN 1000 MG tablet  Commonly known as:  GLUCOPHAGE  Take 1,000 mg by mouth 2 (two) times daily with a meal.     omeprazole 40 MG capsule  Commonly known as:  PRILOSEC  Take 40 mg by mouth daily.     repaglinide 2 MG tablet  Commonly known as:  PRANDIN  Take 2 mg by mouth 3 (three) times daily before meals.     simethicone 125 MG chewable tablet  Commonly known as:  MYLICON  Chew 125 mg by mouth every 6 (six) hours as needed for flatulence.     spironolactone 25 MG tablet  Commonly known as:  ALDACTONE  Take 25 mg by mouth daily.     TOUJEO SOLOSTAR 300 UNIT/ML Sopn  Generic drug:  Insulin Glargine  Inject 20 Units as directed at bedtime.     TRULICITY 0.75 MG/0.5ML Sopn  Generic drug:  Dulaglutide  Inject 0.75 mg as directed every 7 (seven) days.          The results of significant diagnostics from this hospitalization (including imaging, microbiology, ancillary and laboratory) are listed below for reference.     Microbiology: Recent Results (from the past 240 hour(s))  C difficile quick scan w PCR reflex     Status: None   Collection Time: 12/02/14  6:26 AM  Result Value Ref Range Status   C Diff antigen NEGATIVE NEGATIVE Final   C Diff toxin NEGATIVE NEGATIVE Final   C Diff interpretation Negative for toxigenic C. difficile  Final     Labs: Basic Metabolic Panel:  Recent Labs Lab 12/01/14 2109 12/02/14 0310 12/03/14 0546  NA 121* 125* 130*  K 4.5 4.2 4.8  CL 92* 94* 100*  CO2 23 24 24   GLUCOSE 160* 143* 212*  BUN 13 10 12   CREATININE 0.73 0.73 1.05  CALCIUM 9.1 9.0 9.0   Liver Function Tests:  Recent Labs Lab  12/01/14 2109  AST 18  ALT 17  ALKPHOS 88  BILITOT 0.6  PROT 7.1  ALBUMIN 3.4*    Recent Labs Lab 12/01/14 2109  LIPASE 27   No results for input(s): AMMONIA in the last 168 hours. CBC:  Recent Labs Lab 12/01/14 2109 12/02/14 0310 12/03/14 0546  WBC 8.4 6.4 5.2  HGB 12.4* 12.1* 11.5*  HCT 34.4* 34.0* 33.2*  MCV 83.9 85.0 86.7  PLT 210 211 197   Cardiac Enzymes: No results for input(s): CKTOTAL, CKMB, CKMBINDEX, TROPONINI in the last 168 hours. BNP: BNP (last 3 results)  Recent Labs  12/02/14 0300  BNP 379.7*    ProBNP (last 3 results) No results for input(s): PROBNP in the last 8760 hours.  CBG:  Recent Labs Lab 12/02/14 0756 12/02/14 1152 12/02/14 1637 12/02/14 2211  GLUCAP 194* 333* 246* 319*     SIGNED: Time coordinating discharge: 30 minutes  MAGICK-Crockett Rallo, MD  [facility] 12/03/2014, 9:02 AM Pager [phone]  If 7PM-7AM, please contact night-coverage [email or web address] Password [email or web address]

## 2014-12-03 NOTE — Care Management Note (Signed)
Case Management Note  Patient Details  Name: Kyle Barr MRN: [id number] Date of Birth: 06/05/1950  Subjective/Objective:     abdominal pain, nausea, vomiting, diarrhea              Action/Plan: Chart reviewed. No NCM needs identified.   Expected Discharge Date:  12/03/2014               Expected Discharge Plan:  Home/Self Care  In-House Referral:     Discharge planning Services  CM Consult   Status of Service:  Completed, signed off  Medicare Important Message Given:    Date Medicare IM Given:    Medicare IM give by:    Date Additional Medicare IM Given:    Additional Medicare Important Message give by:     If discussed at Long Length of Stay Meetings, dates discussed:    Additional Comments:  Elliot CousinShavis, Jarrah Seher Ellen, RN 12/03/2014, 11:17 AM

## 2014-12-03 NOTE — Discharge Instructions (Signed)

## 2014-12-06 LAB — GI PATHOGEN PANEL BY PCR, STOOL
C difficile toxin A/B: NOT DETECTED
Campylobacter by PCR: NOT DETECTED
Cryptosporidium by PCR: NOT DETECTED
E COLI (ETEC) LT/ST: NOT DETECTED
E COLI (STEC): NOT DETECTED
E COLI 0157 BY PCR: NOT DETECTED
G lamblia by PCR: NOT DETECTED
NOROVIRUS G1/G2: NOT DETECTED
Rotavirus A by PCR: NOT DETECTED
SHIGELLA BY PCR: NOT DETECTED

## 2014-12-06 LAB — STOOL CULTURE

## 2015-01-12 HISTORY — PX: AORTIC VALVE REPLACEMENT: SHX41

## 2015-06-01 ENCOUNTER — Emergency Department (HOSPITAL_COMMUNITY)
Admission: EM | Admit: 2015-06-01 | Discharge: 2015-06-02 | Disposition: A | Discharge status: Home / Self Care | Payer: Medicare Other | Attending: Emergency Medicine | Admitting: Emergency Medicine

## 2015-06-01 ENCOUNTER — Encounter (HOSPITAL_COMMUNITY): Payer: Self-pay

## 2015-06-01 DIAGNOSIS — E119 Type 2 diabetes mellitus without complications: Secondary | ICD-10-CM | POA: Insufficient documentation

## 2015-06-01 DIAGNOSIS — I10 Essential (primary) hypertension: Secondary | ICD-10-CM | POA: Diagnosis not present

## 2015-06-01 DIAGNOSIS — Z7951 Long term (current) use of inhaled steroids: Secondary | ICD-10-CM | POA: Insufficient documentation

## 2015-06-01 DIAGNOSIS — Z79899 Other long term (current) drug therapy: Secondary | ICD-10-CM | POA: Diagnosis not present

## 2015-06-01 DIAGNOSIS — K5900 Constipation, unspecified: Secondary | ICD-10-CM | POA: Diagnosis not present

## 2015-06-01 DIAGNOSIS — R34 Anuria and oliguria: Secondary | ICD-10-CM

## 2015-06-01 DIAGNOSIS — Z87438 Personal history of other diseases of male genital organs: Secondary | ICD-10-CM | POA: Diagnosis not present

## 2015-06-01 DIAGNOSIS — K589 Irritable bowel syndrome without diarrhea: Secondary | ICD-10-CM | POA: Insufficient documentation

## 2015-06-01 DIAGNOSIS — Z7982 Long term (current) use of aspirin: Secondary | ICD-10-CM | POA: Diagnosis not present

## 2015-06-01 DIAGNOSIS — Z87891 Personal history of nicotine dependence: Secondary | ICD-10-CM | POA: Diagnosis not present

## 2015-06-01 DIAGNOSIS — Z7984 Long term (current) use of oral hypoglycemic drugs: Secondary | ICD-10-CM | POA: Diagnosis not present

## 2015-06-01 DIAGNOSIS — Z792 Long term (current) use of antibiotics: Secondary | ICD-10-CM | POA: Insufficient documentation

## 2015-06-01 DIAGNOSIS — I509 Heart failure, unspecified: Secondary | ICD-10-CM | POA: Diagnosis not present

## 2015-06-01 HISTORY — DX: Inflammatory disease of prostate, unspecified: N41.9

## 2015-06-01 HISTORY — DX: Hypo-osmolality and hyponatremia: E87.1

## 2015-06-01 HISTORY — DX: Essential (primary) hypertension: I10

## 2015-06-01 LAB — CBC WITH DIFFERENTIAL/PLATELET
Basophils Absolute: 0 10*3/uL (ref 0.0–0.1)
Basophils Relative: 0 %
Eosinophils Absolute: 0.4 10*3/uL (ref 0.0–0.7)
Eosinophils Relative: 4 %
HEMATOCRIT: 27.3 % — AB (ref 39.0–52.0)
HEMOGLOBIN: 9.3 g/dL — AB (ref 13.0–17.0)
LYMPHS ABS: 1.6 10*3/uL (ref 0.7–4.0)
LYMPHS PCT: 19 %
MCH: 28.3 pg (ref 26.0–34.0)
MCHC: 34.1 g/dL (ref 30.0–36.0)
MCV: 83 fL (ref 78.0–100.0)
Monocytes Absolute: 0.6 10*3/uL (ref 0.1–1.0)
Monocytes Relative: 7 %
NEUTROS PCT: 70 %
Neutro Abs: 6.1 10*3/uL (ref 1.7–7.7)
Platelets: 244 10*3/uL (ref 150–400)
RBC: 3.29 MIL/uL — AB (ref 4.22–5.81)
RDW: 14 % (ref 11.5–15.5)
WBC: 8.7 10*3/uL (ref 4.0–10.5)

## 2015-06-01 LAB — URINE MICROSCOPIC-ADD ON

## 2015-06-01 LAB — BASIC METABOLIC PANEL
Anion gap: 7 (ref 5–15)
BUN: 23 mg/dL — AB (ref 6–20)
CHLORIDE: 97 mmol/L — AB (ref 101–111)
CO2: 25 mmol/L (ref 22–32)
Calcium: 9.3 mg/dL (ref 8.9–10.3)
Creatinine, Ser: 1.18 mg/dL (ref 0.61–1.24)
GFR calc Af Amer: >60 mL/min (ref >60)
GFR calc non Af Amer: >60 mL/min (ref >60)
GLUCOSE: 151 mg/dL — AB (ref 65–99)
POTASSIUM: 4.8 mmol/L (ref 3.5–5.1)
Sodium: 129 mmol/L — ABNORMAL LOW (ref 135–145)

## 2015-06-01 LAB — URINALYSIS, ROUTINE W REFLEX MICROSCOPIC
GLUCOSE, UA: NEGATIVE mg/dL
Hgb urine dipstick: NEGATIVE
KETONES UR: NEGATIVE mg/dL
Leukocytes, UA: NEGATIVE
NITRITE: NEGATIVE
PH: 5 (ref 5.0–8.0)
PROTEIN: 30 mg/dL — AB
Specific Gravity, Urine: 1.018 (ref 1.005–1.030)

## 2015-06-01 LAB — CBG MONITORING, ED: GLUCOSE-CAPILLARY: 123 mg/dL — AB (ref 65–99)

## 2015-06-01 MED ORDER — BACITRACIN ZINC 500 UNIT/GM EX OINT
TOPICAL_OINTMENT | Freq: Two times a day (BID) | CUTANEOUS | Status: DC
  Administered 2015-06-02: via TOPICAL
  Filled 2015-06-01: qty 0.9

## 2015-06-01 MED ORDER — SODIUM CHLORIDE 0.9 % IV BOLUS (SEPSIS)
500.0000 mL | Freq: Once | INTRAVENOUS | Status: AC
  Administered 2015-06-01: 500 mL via INTRAVENOUS

## 2015-06-01 NOTE — ED Notes (Signed)
Bed: WTR6 Expected date:  Expected time:  Means of arrival:  Comments: EMS - pysch eval, stressed

## 2015-06-01 NOTE — ED Notes (Signed)
Pt voided in last hr 

## 2015-06-01 NOTE — Discharge Instructions (Signed)
Hyponatremia °Hyponatremia is when the amount of salt (sodium) in your blood is too low. When sodium levels are low, your cells absorb extra water and they swell. The swelling happens throughout the body, but it mostly affects the brain. °CAUSES °This condition may be caused by: °· Heart, kidney, or liver problems. °· Thyroid problems. °· Adrenal gland problems. °· Metabolic conditions, such as syndrome of inappropriate antidiuretic hormone (SIADH). °· Severe vomiting and diarrhea. °· Certain medicines or illegal drugs. °· Dehydration. °· Drinking too much water. °· Eating a diet that is low in sodium. °· Large burns on your body. °· Sweating. °RISK FACTORS °This condition is more likely to develop in people who: °· Have long-term (chronic) kidney disease. °· Have heart failure. °· Have a medical condition that causes frequent or excessive diarrhea. °· Have metabolic conditions, such as Addison disease or SIADH. °· Take certain medicines that affect the sodium and fluid balance in the blood. Some of these medicine types include: °¨ Diuretics. °¨ NSAIDs. °¨ Some opioid pain medicines. °¨ Some antidepressants. °¨ Some seizure prevention medicines. °SYMPTOMS  °Symptoms of this condition include: °· Nausea and vomiting. °· Confusion. °· Lethargy. °· Agitation. °· Headache. °· Seizures. °· Unconsciousness. °· Appetite loss. °· Muscle weakness and cramping. °· Feeling weak or light-headed. °· Having a rapid heart rate. °· Fainting, in severe cases. °DIAGNOSIS °This condition is diagnosed with a medical history and physical exam. You will also have other tests, including: °· Blood tests. °· Urine tests. °TREATMENT °Treatment for this condition depends on the cause. Treatment may include: °· Fluids given through an IV tube that is inserted into one of your veins. °· Medicines to correct the sodium imbalance. If medicines are causing the condition, the medicines will need to be adjusted. °· Limiting water or fluid intake to  get the correct sodium balance. °HOME CARE INSTRUCTIONS °· Take medicines only as directed by your health care provider. Many medicines can make this condition worse. Talk with your health care provider about any medicines that you are currently taking. °· Carefully follow a recommended diet as directed by your health care provider. °· Carefully follow instructions from your health care provider about fluid restrictions. °· Keep all follow-up visits as directed by your health care provider. This is important. °· Do not drink alcohol. °SEEK MEDICAL CARE IF: °· You develop worsening nausea, fatigue, headache, confusion, or weakness. °· Your symptoms go away and then return. °· You have problems following the recommended diet. °SEEK IMMEDIATE MEDICAL CARE IF: °· You have a seizure. °· You faint. °· You have ongoing diarrhea or vomiting. °  °This information is not intended to replace advice given to you by your health care provider. Make sure you discuss any questions you have with your health care provider. °  °Document Released: 01/18/2002 Document Revised: 06/14/2014 Document Reviewed: 02/17/2014 °Elsevier Interactive Patient Education ©2016 [employer] ° ° ° °

## 2015-06-01 NOTE — ED Notes (Signed)
Patient has not urinated since 0930 today. Patient states he took his diuretic this AM and still has not urinated.. Patient states he has had recent prostatitis and recent hospitalization with the same.  Patient states that he also fell this AM due to tripping on the side walk and now has an abrasion to the right knee. Patient also c/o constipation- patient states he has not had a BM in 9 days

## 2015-06-01 NOTE — ED Provider Notes (Signed)
CSN: [id number]     Arrival date & time 06/01/15  1733 History   First MD Initiated Contact with Patient 06/01/15 2050     Chief Complaint  Patient presents with  . Fall  . can not urinate   . Knee Injury     (Consider location/radiation/quality/duration/timing/severity/associated sxs/prior Treatment) Patient is a 65 y.o. male presenting with male genitourinary complaint and general illness. The history is provided by the patient.  Male GU Problem Presenting symptoms comment:  Decreased urine output Context: spontaneously   Relieved by:  Nothing Worsened by:  Nothing tried Ineffective treatments: diuretics. Associated symptoms: no abdominal pain, no fever, no flank pain and no urinary frequency   Risk factors comment:  Chronic CHF, chronic hyponatremia Illness Duration:  2 days Timing:  Constant Progression:  Unchanged Chronicity:  Recurrent Context:  Restarted diuretics [place] state Associated symptoms: no abdominal pain and no fever     Past Medical History  Diagnosis Date  . IBS (irritable bowel syndrome)     with constipation  . Diabetes mellitus without complication (HCC)   . Heart failure (HCC)   . Aortic stenosis   . Prostatitis   . Hyponatremia   . Hypertension    Past Surgical History  Procedure Laterality Date  . Cataract extraction    . Gallbladder surgery    . Cholecystectomy     Family History  Problem Relation Age of Onset  . Diabetes Mother   . Aortic stenosis Mother   . CAD Mother   . Congestive Heart Failure Father   . Diabetes Father    Social History  Substance Use Topics  . Smoking status: Former Games developermoker  . Smokeless tobacco: Never Used  . Alcohol Use: No    Review of Systems  Constitutional: Negative for fever.  Gastrointestinal: Negative for abdominal pain.  Genitourinary: Negative for frequency and flank pain.  All other systems reviewed and are negative.     Allergies  Review of patient's allergies  indicates no known allergies.  Home Medications   Prior to Admission medications   Medication Sig Start Date End Date Taking? Authorizing Provider  ALPRAZolam Prudy Feeler(XANAX) 0.25 MG tablet Take 0.25 mg by mouth at bedtime as needed for anxiety or sleep.   Yes Historical Provider, MD  ALPRAZolam Prudy Feeler(XANAX) 0.5 MG tablet Take 0.25 mg by mouth at bedtime as needed for anxiety or sleep.   Yes Historical Provider, MD  aspirin EC 81 MG tablet Take 81 mg by mouth daily.   Yes Historical Provider, MD  atorvastatin (LIPITOR) 10 MG tablet Take 10 mg by mouth daily.   Yes Historical Provider, MD  budesonide-formoterol (SYMBICORT) 80-4.5 MCG/ACT inhaler Inhale 2 puffs into the lungs 2 (two) times daily.   Yes Historical Provider, MD  carvedilol (COREG) 12.5 MG tablet Take 12.5 mg by mouth 2 (two) times daily with a meal.   Yes Historical Provider, MD  ciprofloxacin (CIPRO) 500 MG tablet Take 500 mg by mouth 2 (two) times daily.   Yes Historical Provider, MD  furosemide (LASIX) 20 MG tablet Take 20 mg by mouth 2 (two) times daily.   Yes Historical Provider, MD  linaclotide (LINZESS) 290 MCG CAPS capsule Take 290 mcg by mouth daily before breakfast.   Yes Historical Provider, MD  losartan (COZAAR) 25 MG tablet Take 25 mg by mouth daily.   Yes Historical Provider, MD  metFORMIN (GLUCOPHAGE) 1000 MG tablet Take 1,000 mg by mouth 2 (two) times daily with a meal.  Yes Historical Provider, MD  omeprazole (PRILOSEC) 40 MG capsule Take 40 mg by mouth daily.   Yes Historical Provider, MD  repaglinide (PRANDIN) 2 MG tablet Take 2 mg by mouth 2 (two) times daily before a meal.    Yes Historical Provider, MD  simethicone (MYLICON) 125 MG chewable tablet Chew 125 mg by mouth every 6 (six) hours as needed for flatulence.   Yes Historical Provider, MD  tamsulosin (FLOMAX) 0.4 MG CAPS capsule Take 0.4 mg by mouth daily.   Yes Historical Provider, MD  torsemide (DEMADEX) 10 MG tablet Take 10 mg by mouth 2 (two) times daily.   Yes  Historical Provider, MD  TOUJEO SOLOSTAR 300 UNIT/ML SOPN Inject 20 Units as directed at bedtime. 10/19/14  Yes Historical Provider, MD  traMADol (ULTRAM) 50 MG tablet Take 50 mg by mouth every 6 (six) hours as needed for moderate pain or severe pain.   Yes Historical Provider, MD   BP 157/69 mmHg  Pulse 89  Temp(Src) 97.9 F (36.6 C) (Oral)  Resp 20  SpO2 98% Physical Exam  Constitutional: He is oriented to person, place, and time. He appears well-developed and well-nourished. No distress.  HENT:  Head: Normocephalic and atraumatic.  Eyes: Conjunctivae are normal.  Neck: Neck supple. No tracheal deviation present.  Cardiovascular: Normal rate, regular rhythm and normal heart sounds.   Pulmonary/Chest: Effort normal and breath sounds normal. No respiratory distress.  Abdominal: Soft. He exhibits no distension. There is no tenderness.  Neurological: He is alert and oriented to person, place, and time.  Skin: Skin is warm and dry.  Psychiatric: He has a normal mood and affect.  Vitals reviewed.   ED Course  Procedures (including critical care time)  Procedure note: Ultrasound Guided Peripheral IV Ultrasound guided peripheral 1.88 inch angiocath IV placement performed by me. Indications: Nursing unable to place IV. Details: The antecubital fossa and upper arm were evaluated with a multifrequency linear probe. Patent brachial veins were noted. 1 attempt was made to cannulate a vein under realtime US guidance with successful cannulation of the vein and catheter placement. There is return of non-pulsatile dark red blood. The patient tolerated the procedure well without complications. Images archived electronically.  CPT codes: 40981 and [phone]   Labs Review Labs Reviewed  URINALYSIS, ROUTINE W REFLEX MICROSCOPIC (NOT AT [facility]) - Abnormal; Notable for the following:    APPearance CLOUDY (*)    Bilirubin Urine SMALL (*)    Protein, ur 30 (*)    All other components within normal limits   URINE MICROSCOPIC-ADD ON - Abnormal; Notable for the following:    Squamous Epithelial / LPF 0-5 (*)    Bacteria, UA FEW (*)    All other components within normal limits  CBC WITH DIFFERENTIAL/PLATELET - Abnormal; Notable for the following:    RBC 3.29 (*)    Hemoglobin 9.3 (*)    HCT 27.3 (*)    All other components within normal limits  BASIC METABOLIC PANEL - Abnormal; Notable for the following:    Sodium 129 (*)    Chloride 97 (*)    Glucose, Bld 151 (*)    BUN 23 (*)    All other components within normal limits  CBG MONITORING, ED - Abnormal; Notable for the following:    Glucose-Capillary 123 (*)    All other components within normal limits    Imaging Review No results found. I have personally reviewed and evaluated these images and lab results as part of my  medical decision-making.   EKG Interpretation None      MDM   Final diagnoses:  Low urine output    65 y.o. male presents with chief complaint of low urine output. 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 Ws admitted here previously in October for acute on chronic hyponatremia. He has also scraped his knee which otherwise appears normal and refused radiography. He is concerned that with the re-introduction of diuretics to his regimen that he has recurrent hyponatremia. On cipro fro prostatitis that he was diagnosed with in [facility] during recent admission.   No signs of acute hyponatremia, infection, or kidney injury currently. At baseline per historical labs. Given small fluid bolus for resuscitation of possible mild dehydration. Urinated twice during ED visit. I recommended the patient return to his home state to touch base with his PCP and obtain repeat evaluation and he stated it he is not leaving for 4 more days. I explained if he continues to have difficulty urinating he can return for repeat evaluation but currently he would  not require admission as he appears to be urinating adequately and no indication for further emergent workup.     Lyndal Pulley, MD 06/02/15 [phone]

## 2015-06-02 DIAGNOSIS — R34 Anuria and oliguria: Secondary | ICD-10-CM | POA: Diagnosis not present

## 2015-06-02 NOTE — ED Notes (Signed)
Patient had additional questions about his workup, DR Preston FleetingGlick requested to bedside to speak with patient.

## 2015-06-02 NOTE — ED Provider Notes (Signed)
This patient was being discharged, he requested to speak with physician. He has concerns about constipation. Has chronic constipation and takes MiraLAX twice a day without benefit. He has also been tried on Linzess without benefit. I recommended that he could increase his MiraLAX to double the dose twice a day, also recommended over-the-counter bisocodyl or over-the-counter magnesium citrate.  Dione Boozeavid Jocilyn Trego, MD 06/02/15 [id number]

## 2015-06-03 ENCOUNTER — Observation Stay (HOSPITAL_COMMUNITY)
Admission: EM | Admit: 2015-06-03 | Discharge: 2015-06-04 | Disposition: A | Discharge status: Home / Self Care | Payer: Medicare Other | Attending: Internal Medicine | Admitting: Internal Medicine

## 2015-06-03 ENCOUNTER — Encounter (HOSPITAL_COMMUNITY): Payer: Self-pay

## 2015-06-03 DIAGNOSIS — Z794 Long term (current) use of insulin: Secondary | ICD-10-CM | POA: Diagnosis not present

## 2015-06-03 DIAGNOSIS — I110 Hypertensive heart disease with heart failure: Secondary | ICD-10-CM | POA: Insufficient documentation

## 2015-06-03 DIAGNOSIS — I5022 Chronic systolic (congestive) heart failure: Secondary | ICD-10-CM | POA: Diagnosis present

## 2015-06-03 DIAGNOSIS — D649 Anemia, unspecified: Secondary | ICD-10-CM | POA: Diagnosis not present

## 2015-06-03 DIAGNOSIS — I350 Nonrheumatic aortic (valve) stenosis: Secondary | ICD-10-CM

## 2015-06-03 DIAGNOSIS — Z87891 Personal history of nicotine dependence: Secondary | ICD-10-CM | POA: Insufficient documentation

## 2015-06-03 DIAGNOSIS — I4510 Unspecified right bundle-branch block: Secondary | ICD-10-CM | POA: Diagnosis not present

## 2015-06-03 DIAGNOSIS — E869 Volume depletion, unspecified: Secondary | ICD-10-CM | POA: Insufficient documentation

## 2015-06-03 DIAGNOSIS — E871 Hypo-osmolality and hyponatremia: Secondary | ICD-10-CM | POA: Diagnosis not present

## 2015-06-03 DIAGNOSIS — K589 Irritable bowel syndrome without diarrhea: Secondary | ICD-10-CM | POA: Diagnosis not present

## 2015-06-03 DIAGNOSIS — E119 Type 2 diabetes mellitus without complications: Secondary | ICD-10-CM

## 2015-06-03 DIAGNOSIS — Z79899 Other long term (current) drug therapy: Secondary | ICD-10-CM | POA: Insufficient documentation

## 2015-06-03 DIAGNOSIS — N390 Urinary tract infection, site not specified: Secondary | ICD-10-CM | POA: Insufficient documentation

## 2015-06-03 DIAGNOSIS — N419 Inflammatory disease of prostate, unspecified: Secondary | ICD-10-CM | POA: Diagnosis present

## 2015-06-03 DIAGNOSIS — N411 Chronic prostatitis: Principal | ICD-10-CM | POA: Insufficient documentation

## 2015-06-03 DIAGNOSIS — Z9049 Acquired absence of other specified parts of digestive tract: Secondary | ICD-10-CM | POA: Insufficient documentation

## 2015-06-03 DIAGNOSIS — Z7982 Long term (current) use of aspirin: Secondary | ICD-10-CM | POA: Diagnosis not present

## 2015-06-03 DIAGNOSIS — Z953 Presence of xenogenic heart valve: Secondary | ICD-10-CM | POA: Insufficient documentation

## 2015-06-03 DIAGNOSIS — K219 Gastro-esophageal reflux disease without esophagitis: Secondary | ICD-10-CM | POA: Diagnosis present

## 2015-06-03 LAB — CBC
HCT: 29.4 % — ABNORMAL LOW (ref 39.0–52.0)
Hemoglobin: 10 g/dL — ABNORMAL LOW (ref 13.0–17.0)
MCH: 29.1 pg (ref 26.0–34.0)
MCHC: 34 g/dL (ref 30.0–36.0)
MCV: 85.5 fL (ref 78.0–100.0)
PLATELETS: 272 10*3/uL (ref 150–400)
RBC: 3.44 MIL/uL — ABNORMAL LOW (ref 4.22–5.81)
RDW: 14.4 % (ref 11.5–15.5)
WBC: 9.8 10*3/uL (ref 4.0–10.5)

## 2015-06-03 LAB — BASIC METABOLIC PANEL
Anion gap: 8 (ref 5–15)
BUN: 23 mg/dL — AB (ref 6–20)
CHLORIDE: 95 mmol/L — AB (ref 101–111)
CO2: 22 mmol/L (ref 22–32)
CREATININE: 1.16 mg/dL (ref 0.61–1.24)
Calcium: 9.4 mg/dL (ref 8.9–10.3)
GFR calc Af Amer: >60 mL/min (ref >60)
GFR calc non Af Amer: >60 mL/min (ref >60)
GLUCOSE: 181 mg/dL — AB (ref 65–99)
Potassium: 4.8 mmol/L (ref 3.5–5.1)
SODIUM: 125 mmol/L — AB (ref 135–145)

## 2015-06-03 LAB — URINE MICROSCOPIC-ADD ON

## 2015-06-03 LAB — URINALYSIS, ROUTINE W REFLEX MICROSCOPIC
GLUCOSE, UA: NEGATIVE mg/dL
HGB URINE DIPSTICK: NEGATIVE
Ketones, ur: NEGATIVE mg/dL
Leukocytes, UA: NEGATIVE
Nitrite: POSITIVE — AB
PH: 5 (ref 5.0–8.0)
Protein, ur: 100 mg/dL — AB
SPECIFIC GRAVITY, URINE: 1.023 (ref 1.005–1.030)

## 2015-06-03 LAB — CBG MONITORING, ED: Glucose-Capillary: 164 mg/dL — ABNORMAL HIGH (ref 65–99)

## 2015-06-03 MED ORDER — CIPROFLOXACIN IN D5W 400 MG/200ML IV SOLN
400.0000 mg | Freq: Once | INTRAVENOUS | Status: AC
  Administered 2015-06-03: 400 mg via INTRAVENOUS
  Filled 2015-06-03: qty 200

## 2015-06-03 MED ORDER — LACTATED RINGERS IV BOLUS (SEPSIS)
1000.0000 mL | Freq: Once | INTRAVENOUS | Status: AC
  Administered 2015-06-03: 1000 mL via INTRAVENOUS

## 2015-06-03 MED ORDER — CIPROFLOXACIN IN D5W 400 MG/200ML IV SOLN
400.0000 mg | Freq: Two times a day (BID) | INTRAVENOUS | Status: DC
Start: 2015-06-04 — End: 2015-06-04
  Administered 2015-06-04: 400 mg via INTRAVENOUS
  Filled 2015-06-03: qty 200

## 2015-06-03 NOTE — ED Notes (Signed)
CBG 164  

## 2015-06-03 NOTE — ED Notes (Signed)
Patient is alert and oriented x4.  He is complaining swelling in the lower extremities also with weakness.  Patient has notable swelling in both legs.

## 2015-06-03 NOTE — ED Provider Notes (Signed)
CSN: [id number]     Arrival date & time 06/03/15  1855 History   First MD Initiated Contact with Patient 06/03/15 2015     Chief Complaint  Patient presents with  . Urinary Retention  . Dizziness     (Consider location/radiation/quality/duration/timing/severity/associated sxs/prior Treatment) HPI Patient presents today as after being evaluated here with similar concerns. Today, the patient states over the course of the past day he has had minimal urine production. Mild associated suprapubic discomfort, but essentially the patient's crit is concern is the fact that he has had minimal urine production over the past 12 hours. Patient has had episodes of urinary retention in the past. No recent Foley catheter use. Patient has history of hyponatremia as well, has had difficulty with fluid balance in the past. No fever, chills, chest pain, dyspnea.   Past Medical History  Diagnosis Date  . IBS (irritable bowel syndrome)     with constipation  . Diabetes mellitus without complication (HCC)   . Heart failure (HCC)   . Aortic stenosis   . Prostatitis   . Hyponatremia   . Hypertension    Past Surgical History  Procedure Laterality Date  . Cataract extraction    . Gallbladder surgery    . Cholecystectomy    . Aortic valve replacement  12/16   Family History  Problem Relation Age of Onset  . Diabetes Mother   . Aortic stenosis Mother   . CAD Mother   . Congestive Heart Failure Father   . Diabetes Father    Social History  Substance Use Topics  . Smoking status: Former Games developermoker  . Smokeless tobacco: Never Used  . Alcohol Use: No    Review of Systems  Constitutional:       Per HPI, otherwise negative  HENT:       Per HPI, otherwise negative  Respiratory:       Per HPI, otherwise negative  Cardiovascular:       Per HPI, otherwise negative  Gastrointestinal: Negative for vomiting.  Endocrine:       Negative aside from HPI  Genitourinary:       Neg aside from HPI    Musculoskeletal:       Per HPI, otherwise negative  Skin: Negative.   Neurological: Positive for weakness. Negative for syncope.      Allergies  Review of patient's allergies indicates no known allergies.  Home Medications   Prior to Admission medications   Medication Sig Start Date End Date Taking? Authorizing Provider  ALPRAZolam Prudy Feeler(XANAX) 0.25 MG tablet Take 0.25 mg by mouth at bedtime as needed for anxiety or sleep.    Historical Provider, MD  ALPRAZolam Prudy Feeler(XANAX) 0.5 MG tablet Take 0.25 mg by mouth at bedtime as needed for anxiety or sleep.    Historical Provider, MD  aspirin EC 81 MG tablet Take 81 mg by mouth daily.    Historical Provider, MD  atorvastatin (LIPITOR) 10 MG tablet Take 10 mg by mouth daily.    Historical Provider, MD  budesonide-formoterol (SYMBICORT) 80-4.5 MCG/ACT inhaler Inhale 2 puffs into the lungs 2 (two) times daily.    Historical Provider, MD  carvedilol (COREG) 12.5 MG tablet Take 12.5 mg by mouth 2 (two) times daily with a meal.    Historical Provider, MD  ciprofloxacin (CIPRO) 500 MG tablet Take 500 mg by mouth 2 (two) times daily.    Historical Provider, MD  furosemide (LASIX) 20 MG tablet Take 20 mg by mouth 2 (two) times daily.  Historical Provider, MD  linaclotide (LINZESS) 290 MCG CAPS capsule Take 290 mcg by mouth daily before breakfast.    Historical Provider, MD  losartan (COZAAR) 25 MG tablet Take 25 mg by mouth daily.    Historical Provider, MD  metFORMIN (GLUCOPHAGE) 1000 MG tablet Take 1,000 mg by mouth 2 (two) times daily with a meal.    Historical Provider, MD  omeprazole (PRILOSEC) 40 MG capsule Take 40 mg by mouth daily.    Historical Provider, MD  repaglinide (PRANDIN) 2 MG tablet Take 2 mg by mouth 2 (two) times daily before a meal.     Historical Provider, MD  simethicone (MYLICON) 125 MG chewable tablet Chew 125 mg by mouth every 6 (six) hours as needed for flatulence.    Historical Provider, MD  tamsulosin (FLOMAX) 0.4 MG CAPS capsule  Take 0.4 mg by mouth daily.    Historical Provider, MD  torsemide (DEMADEX) 10 MG tablet Take 10 mg by mouth 2 (two) times daily.    Historical Provider, MD  TOUJEO SOLOSTAR 300 UNIT/ML SOPN Inject 20 Units as directed at bedtime. 10/19/14   Historical Provider, MD  traMADol (ULTRAM) 50 MG tablet Take 50 mg by mouth every 6 (six) hours as needed for moderate pain or severe pain.    Historical Provider, MD   BP 147/64 mmHg  Pulse 84  Temp(Src) 97.9 F (36.6 C) (Oral)  Resp 19  Ht  (1.778 m)  Wt 274 lb (124.286 kg)  BMI 39.32 kg/m2  SpO2 99% Physical Exam  Constitutional: He is oriented to person, place, and time. He appears well-developed. No distress.  HENT:  Head: Normocephalic and atraumatic.  Eyes: Conjunctivae and EOM are normal.  Cardiovascular: Normal rate and regular rhythm.   Pulmonary/Chest: Effort normal. No stridor. No respiratory distress.  Abdominal: He exhibits no distension. There is no tenderness.  Musculoskeletal: He exhibits edema.  Neurological: He is alert and oriented to person, place, and time.  Skin: Skin is warm and dry.  Psychiatric: He has a normal mood and affect.  Nursing note and vitals reviewed.   ED Course  Procedures (including critical care time) Labs Review Labs Reviewed  URINALYSIS, ROUTINE W REFLEX MICROSCOPIC (NOT AT [facility]) - Abnormal; Notable for the following:    Color, Urine ORANGE (*)    APPearance CLOUDY (*)    Bilirubin Urine SMALL (*)    Protein, ur 100 (*)    Nitrite POSITIVE (*)    All other components within normal limits  BASIC METABOLIC PANEL - Abnormal; Notable for the following:    Sodium 125 (*)    Chloride 95 (*)    Glucose, Bld 181 (*)    BUN 23 (*)    All other components within normal limits  CBC - Abnormal; Notable for the following:    RBC 3.44 (*)    Hemoglobin 10.0 (*)    HCT 29.4 (*)    All other components within normal limits  URINE MICROSCOPIC-ADD ON - Abnormal; Notable for the following:     Squamous Epithelial / LPF 6-30 (*)    Bacteria, UA FEW (*)    All other components within normal limits  CBG MONITORING, ED - Abnormal; Notable for the following:    Glucose-Capillary 164 (*)    All other components within normal limits   I have personally reviewed and evaluated these images and lab results as part of my medical decision-making.  EKG with sinus rhythm, rate 89, intraventricular conduction delay, abnormal Initial  labs notable for hyponatremia, consistent with prior studies. Bladder scan shows only residual bladder urine.  On repeat exam the patient is in no distress, we discussed all findings, including hyponatremia, slight bump in creatinine from last year, urinary tract infection.  MDM  Patient presents with decreased urine output, is found to have hyponatremia, as well as slight elevation in creatinine. Patient does not have evidence for retention. Patient received fluid resuscitation, IV antibiotics, and given his electrolyte abnormality, required admission for further evaluation and management.  Gerhard Munch, MD 06/03/15 2351

## 2015-06-03 NOTE — ED Notes (Signed)
Pt c/o difficulty voiding since this AM, bilateral leg edema and dizziness. Pt denies pain. Pt was seen here for the same 4/20. Pt states he has hx of hyponatremia. Pt arrives A+OX4, speaking in complete sentences, unlabored RR. Pt denies taking his Rx'd diuretics today. Pt states he had no difficulty voiding yesterday.

## 2015-06-03 NOTE — H&P (Signed)
History and Physical    Kyle BattlesManuel Gamero UJW:[id number] DOB: 08/20/1950 DOA: 06/03/2015  Referring MD/NP/PA: Gerhard Munchobert Lockwood, M.D. PCP: No primary care provider on file.  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  HPI: Kyle Barr is a 65 y.o. male with medical history significant aortic stenosis, S/P AV replacement, chronic systolic CHF with last EF of 45%, diabetes mellitus, chronic hyponatremia, hypertension cancer emergency department with difficulty urinating since earlier this afternoon and worsening lower extremity edema.  Per patient, he is here. 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 He was seen 2 days ago in the emergency department for decreased urine output. He has been treated for recurrent prostatitis after [facility] [place]. He returns today with complaints of no urine output in the afternoon, after he had very good volumes yesterday and today in the morning. He denies fever, but complains of chills, fatigue, suprapubic tenderness, urinary tenesmus and urgency.  ED Course:  The patient received an IV fluid bolus. Basic labs are close to baseline, except for sodium which has decreased from 129 to 125 mmol/L.   Review of Systems: As per HPI otherwise 10 point review of systems negative.   Past Medical History  Diagnosis Date  . IBS (irritable bowel syndrome)     with constipation  . Diabetes mellitus without complication (HCC)   . Heart failure (HCC)   . Aortic stenosis   . Prostatitis   . Hyponatremia   . Hypertension     Past Surgical History  Procedure Laterality Date  . Cataract extraction    . Gallbladder surgery    . Cholecystectomy    . Aortic valve replacement  12/16     reports that he has quit smoking. He has never used smokeless tobacco. He reports that  he does not drink alcohol or use illicit drugs.  No Known Allergies  Family History  Problem Relation Age of Onset  . Diabetes Mother   . Aortic stenosis Mother   . CAD Mother   . Congestive Heart Failure Father   . Diabetes Father     Prior to Admission medications   Medication Sig Start Date End Date Taking? Authorizing Provider  ALPRAZolam Prudy Feeler(XANAX) 0.5 MG tablet Take 0.25 mg by mouth 3 (three) times daily as needed for sleep.    Yes Historical Provider, MD  aspirin EC 81 MG tablet Take 81 mg by mouth daily.   Yes Historical Provider, MD  atorvastatin (LIPITOR) 10 MG tablet Take 10 mg by mouth daily.   Yes Historical Provider, MD  carvedilol (COREG) 12.5 MG tablet Take 12.5 mg by mouth 2 (two) times daily with a meal.   Yes Historical Provider, MD  linaclotide (LINZESS) 290 MCG CAPS capsule Take 290 mcg by mouth daily before breakfast.   Yes Historical Provider, MD  losartan (COZAAR) 25 MG tablet Take 25 mg by mouth daily.   Yes Historical Provider, MD  metFORMIN (GLUCOPHAGE) 1000 MG tablet Take 1,000 mg by mouth 2 (two) times daily with a meal.   Yes Historical Provider, MD  omeprazole (PRILOSEC) 40 MG capsule Take 40 mg by mouth daily.   Yes Historical Provider, MD  repaglinide (PRANDIN) 2 MG tablet Take 2 mg by mouth 2 (two) times daily before a meal.    Yes Historical Provider, MD  Simethicone  250 MG CAPS Take 250 mg by mouth daily as needed (gas).   Yes Historical Provider, MD  tamsulosin (FLOMAX) 0.4 MG CAPS capsule Take 0.4 mg by mouth daily.   Yes Historical Provider, MD  torsemide (DEMADEX) 10 MG tablet Take 10 mg by mouth 2 (two) times daily.   Yes Historical Provider, MD  TOUJEO SOLOSTAR 300 UNIT/ML SOPN Inject 20 Units as directed at bedtime. 10/19/14  Yes Historical Provider, MD  traMADol (ULTRAM) 50 MG tablet Take 50 mg by mouth every 6 (six) hours as needed for moderate pain or severe pain.   Yes Historical Provider, MD  budesonide-formoterol (SYMBICORT) 80-4.5 MCG/ACT  inhaler Inhale 2 puffs into the lungs 2 (two) times daily.    Historical Provider, MD    Physical Exam: Filed Vitals:   06/03/15 2100 06/03/15 2130 06/03/15 2200 06/03/15 2230  BP: 155/71 153/67 150/63 143/61  Pulse: 82 84 87 89  Temp:      TempSrc:      Resp: Height:      Weight:      SpO2: 98% 97% 98% 99%      Constitutional: NAD, calm, comfortable Filed Vitals:   06/03/15 2100 06/03/15 2130 06/03/15 2200 06/03/15 2230  BP: 155/71 153/67 150/63 143/61  Pulse: 82 84 87 89  Temp:      TempSrc:      Resp: Height:      Weight:      SpO2: 98% 97% 98% 99%   Eyes: PERRL, lids and conjunctivae normal ENMT: Mucous membranes are moist. Posterior pharynx clear of any exudate or lesions.Normal dentition.  Neck: normal, supple, no masses, no thyromegaly Respiratory: clear to auscultation bilaterally, no wheezing, no crackles. Normal respiratory effort. No accessory muscle use.  Cardiovascular: Regular rate and rhythm, no murmurs / rubs / gallops. Positive lymphedema and 1+ lower extremity pitting edema. No carotid bruits.  Abdomen: Obese, BS +, positive suprapubic tenderness without guarding or rebound, no masses palpated. No hepatosplenomegaly. Bowel sounds positive.  Musculoskeletal: no clubbing / cyanosis. No joint deformity upper and lower extremities. Good ROM, no contractures. Normal muscle tone.  Skin: no rashes, lesions, ulcers. No induration Neurologic: CN 2-12 grossly intact. Sensation intact, DTR normal. Strength 5/5 in all 4.  Psychiatric: Normal judgment and insight. Alert and oriented x 3. Normal mood.    Labs on Admission: I have personally reviewed following labs and imaging studies  CBC:  Recent Labs Lab 06/01/15 2259 06/03/15 2005  WBC 8.7 9.8  NEUTROABS 6.1  --   HGB 9.3* 10.0*  HCT 27.3* 29.4*  MCV 83.0 85.5  PLT 244 272   Basic Metabolic Panel:  Recent Labs Lab 06/01/15 2259 06/03/15 2005  NA 129* 125*  K 4.8 4.8  CL  97* 95*  CO2 25 22  GLUCOSE 151* 181*  BUN 23* 23*  CREATININE 1.18 1.16  CALCIUM 9.3 9.4     Recent Labs Lab 06/01/15 2353 06/03/15 1950  GLUCAP 123* 164*   Urine analysis:    Component Value Date/Time   COLORURINE ORANGE* 06/03/2015 2024   APPEARANCEUR CLOUDY* 06/03/2015 2024   LABSPEC 1.023 06/03/2015 2024   PHURINE 5.0 06/03/2015 2024   GLUCOSEU NEGATIVE 06/03/2015 2024   HGBUR NEGATIVE 06/03/2015 2024   BILIRUBINUR SMALL* 06/03/2015 2024   KETONESUR NEGATIVE 06/03/2015 2024   PROTEINUR 100* 06/03/2015 2024   UROBILINOGEN 0.2 12/01/2014 2339   NITRITE POSITIVE* 06/03/2015 2024   LEUKOCYTESUR NEGATIVE 06/03/2015  2024    EKG: Independently reviewed. Vent. rate 89 BPM PR interval 205 ms QRS duration 169 ms QT/QTc 380/462 ms P-R-T axes 37 -86 23 Sinus rhythm Left anterior fasicular block Right bundle branch block Abnormal ekg  Assessment/Plan Principal Problem:   Hyponatremia Admit to telemetry/observation. Hold torsemide. Patient received a liter bolus in the emergency department. Monitor intake and output. Check serum osmolality. Check urine sodium. Check a.m. cortisol and ACTH level. Follow-up sodium level in the morning.  Active Problems:   Chronic systolic heart failure (HCC) Per patient, his most recent EF was 45% after AV replacement. Continue carvedilol 12.5 mg by mouth twice a day.    Aortic stenosis Status post recent porcine AV replacement.   Prostatitis Continue ciprofloxacin IVP. Given the fact that this is a recurrent prostatitis,  that the patient should have a 4 week course of CIPRO.    Diabetes mellitus without complication (HCC) Carbohydrate modified diet. Continue long-acting insulin. CBG monitoring with regular insulin sliding scale.      IBS (irritable bowel syndrome) Continue Linzess 290 g by mouth daily before breakfast.    GERD (gastroesophageal reflux disease) Continue proton pump inhibitor.      Anemia Stable. Monitor hematocrit and hemoglobin.    DVT prophylaxis: Lovenox SQ. Code Status: Full code. Family Communication:  Disposition Plan: Admit for overnight observation. Consults called: None. Admission status: Observation/Telemetry   Bobette Mo MD [facility] Pager [phone].  If 7PM-7AM, please contact night-coverage [email or web address] Password [facility]  06/03/2015, 11:43 PM

## 2015-06-04 DIAGNOSIS — E119 Type 2 diabetes mellitus without complications: Secondary | ICD-10-CM

## 2015-06-04 DIAGNOSIS — N411 Chronic prostatitis: Secondary | ICD-10-CM | POA: Diagnosis not present

## 2015-06-04 DIAGNOSIS — N390 Urinary tract infection, site not specified: Secondary | ICD-10-CM

## 2015-06-04 DIAGNOSIS — I5022 Chronic systolic (congestive) heart failure: Secondary | ICD-10-CM

## 2015-06-04 DIAGNOSIS — I350 Nonrheumatic aortic (valve) stenosis: Secondary | ICD-10-CM

## 2015-06-04 DIAGNOSIS — E871 Hypo-osmolality and hyponatremia: Secondary | ICD-10-CM | POA: Diagnosis not present

## 2015-06-04 LAB — CBC WITH DIFFERENTIAL/PLATELET
Basophils Absolute: 0 10*3/uL (ref 0.0–0.1)
Basophils Relative: 0 %
Eosinophils Absolute: 0.5 10*3/uL (ref 0.0–0.7)
Eosinophils Relative: 6 %
HEMATOCRIT: 27.6 % — AB (ref 39.0–52.0)
HEMOGLOBIN: 9.7 g/dL — AB (ref 13.0–17.0)
LYMPHS ABS: 1.4 10*3/uL (ref 0.7–4.0)
Lymphocytes Relative: 19 %
MCH: 29.2 pg (ref 26.0–34.0)
MCHC: 35.1 g/dL (ref 30.0–36.0)
MCV: 83.1 fL (ref 78.0–100.0)
MONOS PCT: 6 %
Monocytes Absolute: 0.4 10*3/uL (ref 0.1–1.0)
NEUTROS ABS: 5.1 10*3/uL (ref 1.7–7.7)
NEUTROS PCT: 69 %
Platelets: 239 10*3/uL (ref 150–400)
RBC: 3.32 MIL/uL — ABNORMAL LOW (ref 4.22–5.81)
RDW: 14.1 % (ref 11.5–15.5)
WBC: 7.5 10*3/uL (ref 4.0–10.5)

## 2015-06-04 LAB — COMPREHENSIVE METABOLIC PANEL
ALK PHOS: 112 U/L (ref 38–126)
ALT: 23 U/L (ref 17–63)
AST: 21 U/L (ref 15–41)
Albumin: 3.2 g/dL — ABNORMAL LOW (ref 3.5–5.0)
Anion gap: 9 (ref 5–15)
BUN: 18 mg/dL (ref 6–20)
CALCIUM: 9.6 mg/dL (ref 8.9–10.3)
CO2: 25 mmol/L (ref 22–32)
CREATININE: 0.92 mg/dL (ref 0.61–1.24)
Chloride: 96 mmol/L — ABNORMAL LOW (ref 101–111)
GFR calc Af Amer: >60 mL/min (ref >60)
GFR calc non Af Amer: >60 mL/min (ref >60)
GLUCOSE: 198 mg/dL — AB (ref 65–99)
Potassium: 4.8 mmol/L (ref 3.5–5.1)
Sodium: 130 mmol/L — ABNORMAL LOW (ref 135–145)
Total Bilirubin: 0.5 mg/dL (ref 0.3–1.2)
Total Protein: 6.6 g/dL (ref 6.5–8.1)

## 2015-06-04 LAB — MAGNESIUM
MAGNESIUM: 1.3 mg/dL — AB (ref 1.7–2.4)
Magnesium: 1.5 mg/dL — ABNORMAL LOW (ref 1.7–2.4)

## 2015-06-04 LAB — GLUCOSE, CAPILLARY
GLUCOSE-CAPILLARY: 219 mg/dL — AB (ref 65–99)
Glucose-Capillary: 244 mg/dL — ABNORMAL HIGH (ref 65–99)
Glucose-Capillary: 191 mg/dL — ABNORMAL HIGH (ref 65–99)

## 2015-06-04 LAB — PHOSPHORUS: Phosphorus: 3.4 mg/dL (ref 2.5–4.6)

## 2015-06-04 LAB — CORTISOL-AM, BLOOD: Cortisol - AM: 17.4 ug/dL (ref 6.7–22.6)

## 2015-06-04 LAB — SODIUM, URINE, RANDOM: SODIUM UR: 27 mmol/L

## 2015-06-04 LAB — OSMOLALITY: Osmolality: 279 mOsm/kg (ref 275–295)

## 2015-06-04 MED ORDER — DOXYCYCLINE HYCLATE 100 MG PO CAPS: 100.0000 mg | ORAL_CAPSULE | Freq: Two times a day (BID) | ORAL | Status: AC

## 2015-06-04 MED ORDER — DEXTROSE 5 % IV SOLN
1.0000 g | INTRAVENOUS | Status: DC
  Administered 2015-06-04: 1 g via INTRAVENOUS
  Filled 2015-06-04: qty 10

## 2015-06-04 MED ORDER — ENOXAPARIN SODIUM 60 MG/0.6ML ~~LOC~~ SOLN
60.0000 mg | SUBCUTANEOUS | Status: DC
Start: 2015-06-04 — End: 2015-06-04
  Filled 2015-06-04: qty 0.6

## 2015-06-04 MED ORDER — TORSEMIDE 10 MG PO TABS: 10.0000 mg | ORAL_TABLET | Freq: Two times a day (BID) | ORAL | Status: AC

## 2015-06-04 MED ORDER — SODIUM CHLORIDE 0.9 % IV BOLUS (SEPSIS)
500.0000 mL | Freq: Once | INTRAVENOUS | Status: AC
  Administered 2015-06-04: 500 mL via INTRAVENOUS

## 2015-06-04 MED ORDER — MELOXICAM 7.5 MG PO TABS: 7.5000 mg | ORAL_TABLET | Freq: Every day | ORAL | Status: AC

## 2015-06-04 MED ORDER — LOSARTAN POTASSIUM 25 MG PO TABS
25.0000 mg | ORAL_TABLET | Freq: Every day | ORAL | Status: DC
Start: 2015-06-04 — End: 2015-06-04
  Administered 2015-06-04: 25 mg via ORAL
  Filled 2015-06-04 (×2): qty 1

## 2015-06-04 MED ORDER — SODIUM CHLORIDE 0.9 % IV SOLN
INTRAVENOUS | Status: DC
  Administered 2015-06-04: 11:00:00 via INTRAVENOUS

## 2015-06-04 MED ORDER — PANTOPRAZOLE SODIUM 40 MG PO TBEC
40.0000 mg | DELAYED_RELEASE_TABLET | Freq: Every day | ORAL | Status: DC
  Administered 2015-06-04: 40 mg via ORAL
  Filled 2015-06-04: qty 1

## 2015-06-04 MED ORDER — TAMSULOSIN HCL 0.4 MG PO CAPS
0.4000 mg | ORAL_CAPSULE | Freq: Every day | ORAL | Status: DC
  Administered 2015-06-04: 0.4 mg via ORAL
  Filled 2015-06-04: qty 1

## 2015-06-04 MED ORDER — PHENAZOPYRIDINE HCL 200 MG PO TABS
200.0000 mg | ORAL_TABLET | Freq: Three times a day (TID) | ORAL | Status: DC
  Administered 2015-06-04: 200 mg via ORAL
  Filled 2015-06-04 (×2): qty 1

## 2015-06-04 MED ORDER — MOMETASONE FURO-FORMOTEROL FUM 100-5 MCG/ACT IN AERO
2.0000 | INHALATION_SPRAY | Freq: Two times a day (BID) | RESPIRATORY_TRACT | Status: DC
  Filled 2015-06-04: qty 8.8

## 2015-06-04 MED ORDER — NYSTATIN 100000 UNIT/GM EX CREA: TOPICAL_CREAM | Freq: Two times a day (BID) | CUTANEOUS | Status: AC

## 2015-06-04 MED ORDER — NYSTATIN 100000 UNIT/GM EX CREA
TOPICAL_CREAM | Freq: Two times a day (BID) | CUTANEOUS | Status: DC
  Administered 2015-06-04: 12:00:00 via TOPICAL
  Filled 2015-06-04: qty 15

## 2015-06-04 MED ORDER — MAGNESIUM OXIDE 400 (241.3 MG) MG PO TABS: 400.0000 mg | ORAL_TABLET | Freq: Two times a day (BID) | ORAL | Status: AC

## 2015-06-04 MED ORDER — METFORMIN HCL 500 MG PO TABS
1000.0000 mg | ORAL_TABLET | Freq: Two times a day (BID) | ORAL | Status: DC
  Administered 2015-06-04: 1000 mg via ORAL
  Filled 2015-06-04 (×2): qty 2

## 2015-06-04 MED ORDER — HEPARIN SODIUM (PORCINE) 5000 UNIT/ML IJ SOLN: 5000.0000 [IU] | Freq: Three times a day (TID) | INTRAMUSCULAR | Status: DC

## 2015-06-04 MED ORDER — CARVEDILOL 12.5 MG PO TABS
12.5000 mg | ORAL_TABLET | Freq: Two times a day (BID) | ORAL | Status: DC
  Administered 2015-06-04: 12.5 mg via ORAL
  Filled 2015-06-04: qty 1

## 2015-06-04 MED ORDER — SIMETHICONE 80 MG PO CHEW
250.0000 mg | CHEWABLE_TABLET | Freq: Every day | ORAL | Status: DC | PRN
Start: 2015-06-04 — End: 2015-06-04

## 2015-06-04 MED ORDER — ASPIRIN EC 81 MG PO TBEC
81.0000 mg | DELAYED_RELEASE_TABLET | Freq: Every day | ORAL | Status: DC
Start: 2015-06-04 — End: 2015-06-04
  Administered 2015-06-04: 81 mg via ORAL
  Filled 2015-06-04: qty 1

## 2015-06-04 MED ORDER — INSULIN ASPART 100 UNIT/ML ~~LOC~~ SOLN
0.0000 [IU] | Freq: Three times a day (TID) | SUBCUTANEOUS | Status: DC
  Administered 2015-06-04: 5 [IU] via SUBCUTANEOUS
  Administered 2015-06-04: 3 [IU] via SUBCUTANEOUS

## 2015-06-04 MED ORDER — TRAMADOL HCL 50 MG PO TABS: 50.0000 mg | ORAL_TABLET | Freq: Four times a day (QID) | ORAL | Status: DC | PRN

## 2015-06-04 MED ORDER — ATORVASTATIN CALCIUM 10 MG PO TABS
10.0000 mg | ORAL_TABLET | Freq: Every day | ORAL | Status: DC
Start: 2015-06-04 — End: 2015-06-04
  Administered 2015-06-04: 10 mg via ORAL
  Filled 2015-06-04: qty 1

## 2015-06-04 MED ORDER — TAMSULOSIN HCL 0.4 MG PO CAPS: 0.4000 mg | ORAL_CAPSULE | Freq: Two times a day (BID) | ORAL | Status: AC

## 2015-06-04 MED ORDER — INSULIN GLARGINE 100 UNIT/ML ~~LOC~~ SOLN
20.0000 [IU] | Freq: Every day | SUBCUTANEOUS | Status: DC
  Administered 2015-06-04: 20 [IU] via SUBCUTANEOUS
  Filled 2015-06-04 (×2): qty 0.2

## 2015-06-04 MED ORDER — MAGNESIUM OXIDE 400 (241.3 MG) MG PO TABS
400.0000 mg | ORAL_TABLET | Freq: Two times a day (BID) | ORAL | Status: DC
  Administered 2015-06-04: 400 mg via ORAL
  Filled 2015-06-04 (×2): qty 1

## 2015-06-04 MED ORDER — PHENAZOPYRIDINE HCL 200 MG PO TABS: 200.0000 mg | ORAL_TABLET | Freq: Three times a day (TID) | ORAL | Status: AC

## 2015-06-04 MED ORDER — ALPRAZOLAM 0.5 MG PO TABS
0.5000 mg | ORAL_TABLET | Freq: Three times a day (TID) | ORAL | Status: DC | PRN
  Administered 2015-06-04: 0.5 mg via ORAL
  Filled 2015-06-04: qty 1

## 2015-06-04 MED ORDER — LINACLOTIDE 290 MCG PO CAPS
290.0000 ug | ORAL_CAPSULE | Freq: Every day | ORAL | Status: DC
  Filled 2015-06-04: qty 1

## 2015-06-04 MED ORDER — TAMSULOSIN HCL 0.4 MG PO CAPS
0.4000 mg | ORAL_CAPSULE | Freq: Two times a day (BID) | ORAL | Status: DC
  Filled 2015-06-04: qty 1

## 2015-06-04 MED ORDER — MAGNESIUM SULFATE 4 GM/100ML IV SOLN
4.0000 g | Freq: Once | INTRAVENOUS | Status: AC
  Administered 2015-06-04: 4 g via INTRAVENOUS
  Filled 2015-06-04: qty 100

## 2015-06-04 MED ORDER — INSULIN GLARGINE 300 UNIT/ML ~~LOC~~ SOPN: 20.0000 [IU] | PEN_INJECTOR | Freq: Every day | SUBCUTANEOUS | Status: DC

## 2015-06-04 MED ORDER — HEPARIN SODIUM (PORCINE) 5000 UNIT/ML IJ SOLN
5000.0000 [IU] | Freq: Three times a day (TID) | INTRAMUSCULAR | Status: DC
Start: 2015-06-04 — End: 2015-06-04
  Filled 2015-06-04 (×2): qty 1

## 2015-06-04 MED ORDER — MELOXICAM 7.5 MG PO TABS
7.5000 mg | ORAL_TABLET | Freq: Every day | ORAL | Status: DC
  Filled 2015-06-04 (×2): qty 1

## 2015-06-04 MED ORDER — REPAGLINIDE 2 MG PO TABS
2.0000 mg | ORAL_TABLET | Freq: Two times a day (BID) | ORAL | Status: DC
  Administered 2015-06-04: 2 mg via ORAL
  Filled 2015-06-04 (×2): qty 1

## 2015-06-04 NOTE — Discharge Summary (Signed)
Physician Discharge Summary  Kyle Barr ZOX:[id number] DOB: 01/20/1951 DOA: 06/03/2015  PCP: No primary care provider on file.  Admit date: 06/03/2015 Discharge date: 06/04/2015  Time spent: 65 minutes  Recommendations for Outpatient Follow-up:  1. Follow-up with local urologist when patient is back to [place] for follow-up on chronic prostatitis. Patient is being discharged on 2 weeks of oral doxycycline. 2. Follow-up with cardiology in 1-2 weeks, for follow-up on worsening lower extremity edema, lower urinary output and dehydration. 3. Follow-up with PCP in 1-2 weeks. On follow-up patient will need a basic metabolic profile done to follow-up on electrolytes and renal function.   Discharge Diagnoses:  Principal Problem:   Prostatitis Active Problems:   Hyponatremia   IBS (irritable bowel syndrome)   Diabetes mellitus without complication (HCC)   Aortic stenosis   Chronic systolic heart failure (HCC)   GERD (gastroesophageal reflux disease)   Anemia   Discharge Condition: Stable and improved  Diet recommendation: Heart healthy  Filed Weights   06/03/15 1934 06/04/15 0100  Weight: 124.286 kg (274 lb) 124.2 kg (273 lb 13 oz)    History of present illness:  Per Dr. Rutha Bouchard Kliebert is a 65 y.o. male with medical history significant aortic stenosis, S/P AV replacement, chronic systolic CHF with last EF of 45%, diabetes mellitus, chronic hyponatremia, hypertension cancer emergency department with difficulty urinating since earlier on the afternoon of admission and worsening lower extremity edema.  Per patient, he is here. 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 He was seen 2 days prior to admission in the emergency department for decreased urine output. He had been treated for recurrent prostatitis after recent admission at [facility] in [place]. He returned on the day of admission, with complaints of no urine  output in the afternoon, after he had very good volumes the day before admission and on the morning of admission. He denied fever, but complained of chills, fatigue, suprapubic tenderness, urinary tenesmus and urgency.  ED Course:  The patient received an IV fluid bolus. Basic labs are close to baseline, except for sodium which has decreased from 129 to 125 mmol/L.    Hospital Course:  #1 chronic prostatitis Patient presenting with prostatitis with decreased urine output and dysuria. Urinalysis was done which was nitrite-positive negative leukocytes. Urine cultures were pending at time of discharge. Patient was placed empirically on IV Rocephin and urology consultation obtained. Patient remained afebrile and did not have any signs of systemic toxicity. Patient had decreased urine output which was felt to be consistent with dehydration secondary to diuretics and poor oral intake. Patient was hydrated with IV fluids and had good urine output. Patient remained afebrile. Urology consultation was obtained patient was seen in consultation by Dr. Karleen Hampshire who recommended a dose of IV Rocephin during the hospitalization which patient received and discharged on 2 weeks of oral doxycycline for per treatment of prostatitis, 2-4 weeks of Mobitz 7.5 mg daily for prostatic inflammation which patient stated he would have to check with his cardiologist. It was also recommended to double dose of Flomax to 0.4 mg twice daily given sensation of incomplete [name] as well as to discharge on Pyridium for symptomatic relief of dysuria. Was recommended that patient increase his fluid intake and follow-up with his local urologist regarding his chronic prostatitis. Was also recommended that patient follow-up with his local cardiologist and nephrologist regarding worsening lower extremity edema, low urine output and dehydration. It  was felt per urology the patient was [name] to be discharged so he could fly back home tomorrow morning.  Patient remained afebrile with a normal white count be discharged in stable condition. Outpatient follow-up.   #2 acute on chronic hyponatremia On admission patient was noted to be hyponatremic with a sodium level of 125 with a baseline level of approximately 130. It was felt this was likely secondary to hypovolemic hyponatremia as patient was on diuretics with decreased oral intake and decreased urinary output. Patient's diuretics were held and patient was hydrated with IV fluids with resolution of his acute on chronic hyponatremia since then by day of discharge his sodium level was 130. Outpatient follow-up.  #3 hypomagnesemia Repleted during the hospitalization. Patient be discharged on 2 weeks of magnesium oxide. Outpatient follow-up.  #4 chronic systolic heart failure/aortic stenosis status post porcine AV replacement Remained stable throughout the hospitalization. Patient was maintained on home regimen of Coreg. Patient's diuretics were held secondary to hyponatremia volume depletion. Patient is to resume his home regimen of diuretics 3 days post discharge. Patient is to follow-up with his cardiologist as outpatient.  #5 diabetes mellitus Remained stable during the hospitalization. Patient was maintained on his home regimen of long-acting insulin as well as a sliding scale insulin.  #6 irritable bowel syndrome Patient was continued on his home regimen of Linzess.  #7 gastroesophageal reflux disease Patient maintained on a PPI.  The rest of patient's chronic medical issues remained stable throughout the hospitalization and patient be discharged in stable and improved condition.   Procedures:  None  Consultations:  Urology: Dr Karleen HampshireSpencer 06/04/2015  Discharge Exam: Filed Vitals:   06/04/15 0514 06/04/15 1341  BP: 150/92 135/60  Pulse: 105 85  Temp: 98 F (36.7 C) 97.8 F (36.6 C)  Resp: 18 18    General: NAD Cardiovascular: RRR Respiratory: CTAB  Discharge  Instructions   Discharge Instructions    Diet - low sodium heart healthy    Complete by:  As directed      Discharge instructions    Complete by:  As directed   Follow up with urologist when you get back. Follow up with your cardiologist and nephrologist for further evaluation on LE swelling and low urine output and dehydration. Follow up with PCP in 1 weeks.     Increase activity slowly    Complete by:  As directed           Current Discharge Medication List    START taking these medications   Details  doxycycline (VIBRAMYCIN) 100 MG capsule Take 1 capsule (100 mg total) by mouth 2 (two) times daily. Take for 2 weeks Qty: 28 capsule, Refills: 0    magnesium oxide (MAG-OX) 400 (241.3 Mg) MG tablet Take 1 tablet (400 mg total) by mouth 2 (two) times daily. Qty: 28 tablet, Refills: 0    meloxicam (MOBIC) 7.5 MG tablet Take 1 tablet (7.5 mg total) by mouth daily. Qty: 30 tablet, Refills: 0    nystatin cream (MYCOSTATIN) Apply topically 2 (two) times daily. Qty: 30 g, Refills: 0    phenazopyridine (PYRIDIUM) 200 MG tablet Take 1 tablet (200 mg total) by mouth 3 (three) times daily with meals. Take for 5 days then stop. Qty: 15 tablet, Refills: 0      CONTINUE these medications which have CHANGED   Details  tamsulosin (FLOMAX) 0.4 MG CAPS capsule Take 1 capsule (0.4 mg total) by mouth 2 (two) times daily. Qty: 60 capsule, Refills: 0  torsemide (DEMADEX) 10 MG tablet Take 1 tablet (10 mg total) by mouth 2 (two) times daily. Resume in 3 days.      CONTINUE these medications which have NOT CHANGED   Details  ALPRAZolam (XANAX) 0.5 MG tablet Take 0.25 mg by mouth 3 (three) times daily as needed for sleep.     aspirin EC 81 MG tablet Take 81 mg by mouth daily.    atorvastatin (LIPITOR) 10 MG tablet Take 10 mg by mouth daily.    carvedilol (COREG) 12.5 MG tablet Take 12.5 mg by mouth 2 (two) times daily with a meal.    linaclotide (LINZESS) 290 MCG CAPS capsule Take 290  mcg by mouth daily before breakfast.    losartan (COZAAR) 25 MG tablet Take 25 mg by mouth daily.    metFORMIN (GLUCOPHAGE) 1000 MG tablet Take 1,000 mg by mouth 2 (two) times daily with a meal.    omeprazole (PRILOSEC) 40 MG capsule Take 40 mg by mouth daily.    repaglinide (PRANDIN) 2 MG tablet Take 2 mg by mouth 2 (two) times daily before a meal.     Simethicone 250 MG CAPS Take 250 mg by mouth daily as needed (gas).    TOUJEO SOLOSTAR 300 UNIT/ML SOPN Inject 20 Units as directed at bedtime. Refills: 3    traMADol (ULTRAM) 50 MG tablet Take 50 mg by mouth every 6 (six) hours as needed for moderate pain or severe pain.    budesonide-formoterol (SYMBICORT) 80-4.5 MCG/ACT inhaler Inhale 2 puffs into the lungs 2 (two) times daily.       No Known Allergies Follow-up Information    Schedule an appointment as soon as possible for a visit in 1 week to follow up.   Why:  f/u with PCP in 1 weeks.      Schedule an appointment as soon as possible for a visit in 3 days to follow up.   Why:  f/u with your urologist in 3 days.      Schedule an appointment as soon as possible for a visit in 1 week to follow up.   Why:  follow up with cardiologist        The results of significant diagnostics from this hospitalization (including imaging, microbiology, ancillary and laboratory) are listed below for reference.    Significant Diagnostic Studies: No results found.  Microbiology: No results found for this or any previous visit (from the past 240 hour(s)).   Labs: Basic Metabolic Panel:  Recent Labs Lab 06/01/15 2259 06/03/15 2005 06/04/15 0720 06/04/15 1204  NA 129* 125* 130*  --   K 4.8 4.8 4.8  --   CL 97* 95* 96*  --   CO2 --   GLUCOSE 151* 181* 198*  --   BUN 23* 23* 18  --   CREATININE 1.18 1.16 0.92  --   CALCIUM 9.3 9.4 9.6  --   MG  --  1.3*  --  1.5*  PHOS  --  3.4  --   --    Liver Function Tests:  Recent Labs Lab 06/04/15 0720  AST 21  ALT 23   ALKPHOS 112  BILITOT 0.5  PROT 6.6  ALBUMIN 3.2*   No results for input(s): LIPASE, AMYLASE in the last 168 hours. No results for input(s): AMMONIA in the last 168 hours. CBC:  Recent Labs Lab 06/01/15 2259 06/03/15 2005 06/04/15 0720  WBC 8.7 9.8 7.5  NEUTROABS 6.1  --  5.1  HGB 9.3* 10.0* 9.7*  HCT 27.3* 29.4* 27.6*  MCV 83.0 85.5 83.1  PLT 244 272 239   Cardiac Enzymes: No results for input(s): CKTOTAL, CKMB, CKMBINDEX, TROPONINI in the last 168 hours. BNP: BNP (last 3 results)  Recent Labs  12/02/14 0300  BNP 379.7*    ProBNP (last 3 results) No results for input(s): PROBNP in the last 8760 hours.  CBG:  Recent Labs Lab 06/01/15 2353 06/03/15 1950 06/04/15 0204 06/04/15 0749 06/04/15 1154  GLUCAP 123* 164* 244* 191* 219*       Signed:  THOMPSON,DANIEL MD.  [facility] 06/04/2015, 4:49 PM

## 2015-06-04 NOTE — Consult Note (Signed)
Urology Consult  Referring physician: Tennis Must, MD Reason for referral: Chronic prostatitis  Chief Complaint: Chronic prostatitis  History of Present Illness:  Kyle Barr is a 65 y.o. male with medical history significant aortic stenosis, S/P AV replacement, chronic systolic CHF with last EF of 45%, diabetes mellitus, chronic hyponatremia, hypertension and recurrent chronic prostatitis presented to the emergency department with no urination in 8 hours and suprapubic burning as well as and worsening lower extremity edema.  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 He was seen 2 days ago in the emergency department for decreased urine output. In the ER he was bladder scanned with no evidence of urinary retention and UA was without evidence of UTI or systemic leukocystosis. He was subsequently discharged.   He does note that he was hospitalized for hyponatremia recently and his diuretics were changed and he has been having issues with alternating between volume overload and dehydration. Additionally, he was treated with several days of IV antibiotics for recurrent prostatitis during a recent admission at [facility] clinic in [facility] but was only discharged with 3 days of outpatient cipro.  Of note, he has had no fevers associated with any of these episodes.    He returns today with complaints of no urine output in the afternoon, after he had very good volumes yesterday and today in the morning. Bladder scan remained low with no evidence of retention. He denies fever, but complains of chills, fatigue, suprapubic tenderness, sensation of incomplete emptying and urgency. IN the ER he was afebrile, mildly tachycardic to 105, stable BP, UA was nitrite positive but he is on pyridium otherwise equivocal with negative LE and 6-30 WBCs and 6-30 squamous cells. WBC was 7.4.  Past Medical History  Diagnosis Date  . IBS (irritable bowel  syndrome)     with constipation  . Diabetes mellitus without complication ([place])   . Heart failure ([place])   . Aortic stenosis   . Prostatitis   . Hyponatremia   . Hypertension    Past Surgical History  Procedure Laterality Date  . Cataract extraction    . Gallbladder surgery    . Cholecystectomy    . Aortic valve replacement  12/16    Medications: I have reviewed the patient's current medications. Allergies: No Known Allergies  Family History  Problem Relation Age of Onset  . Diabetes Mother   . Aortic stenosis Mother   . CAD Mother   . Congestive Heart Failure Father   . Diabetes Father    Social History:  reports that he has quit smoking. He has never used smokeless tobacco. He reports that he does not drink alcohol or use illicit drugs.  Review of Systems  All other systems reviewed and are negative.   Physical Exam:  Vital signs in last 24 hours: Temp:  [97.9 F (36.6 C)-98.2 F (36.8 C)] 98 F (36.7 C) (04/23 0514) Pulse Rate:  [82-108] 105 (04/23 0514) Resp:  [10-24] 18 (04/23 0514) BP: (139-155)/(61-92) 150/92 mmHg (04/23 0514) SpO2:  [97 %-99 %] 98 % (04/23 0514) Weight:  [124.2 kg (273 lb 13 oz)-124.286 kg (274 lb)] 124.2 kg (273 lb 13 oz) (04/23 0100) Physical Exam NAD, alert, non-toxic appearing No increased WOB on RA Abd S/ND/NT Moderate LE edema  Laboratory Data:  Results for orders placed or performed during the hospital encounter of 06/03/15 (from the past 72 hour(s))  CBG monitoring, ED  Status: Abnormal   Collection Time: 06/03/15  7:50 PM  Result Value Ref Range   Glucose-Capillary 164 (H) 65 - 99 mg/dL  Basic metabolic panel     Status: Abnormal   Collection Time: 06/03/15  8:05 PM  Result Value Ref Range   Sodium 125 (L) 135 - 145 mmol/L   Potassium 4.8 3.5 - 5.1 mmol/L   Chloride 95 (L) 101 - 111 mmol/L   CO2 22 22 - 32 mmol/L   Glucose, Bld 181 (H) 65 - 99 mg/dL   BUN 23 (H) 6 - 20 mg/dL   Creatinine, Ser 1.16 0.61 - 1.24 mg/dL    Calcium 9.4 8.9 - 10.3 mg/dL   GFR calc non Af Amer >60 >60 mL/min   GFR calc Af Amer >60 >60 mL/min    Comment: (NOTE) The eGFR has been calculated using the CKD EPI equation. This calculation has not been validated in all clinical situations. eGFR's persistently <60 mL/min signify possible Chronic Kidney Disease.    Anion gap 8 5 - 15  CBC     Status: Abnormal   Collection Time: 06/03/15  8:05 PM  Result Value Ref Range   WBC 9.8 4.0 - 10.5 K/uL   RBC 3.44 (L) 4.22 - 5.81 MIL/uL   Hemoglobin 10.0 (L) 13.0 - 17.0 g/dL   HCT 29.4 (L) 39.0 - 52.0 %   MCV 85.5 78.0 - 100.0 fL   MCH 29.1 26.0 - 34.0 pg   MCHC 34.0 30.0 - 36.0 g/dL   RDW 14.4 11.5 - 15.5 %   Platelets 272 150 - 400 K/uL  Magnesium     Status: Abnormal   Collection Time: 06/03/15  8:05 PM  Result Value Ref Range   Magnesium 1.3 (L) 1.7 - 2.4 mg/dL  Phosphorus     Status: None   Collection Time: 06/03/15  8:05 PM  Result Value Ref Range   Phosphorus 3.4 2.5 - 4.6 mg/dL  Urinalysis, Routine w reflex microscopic-may I&O cath if menses (not at [facility])     Status: Abnormal   Collection Time: 06/03/15  8:24 PM  Result Value Ref Range   Color, Urine ORANGE (A) YELLOW    Comment: BIOCHEMICALS MAY BE AFFECTED BY COLOR   APPearance CLOUDY (A) CLEAR   Specific Gravity, Urine 1.023 1.005 - 1.030   pH 5.0 5.0 - 8.0   Glucose, UA NEGATIVE NEGATIVE mg/dL   Hgb urine dipstick NEGATIVE NEGATIVE   Bilirubin Urine SMALL (A) NEGATIVE   Ketones, ur NEGATIVE NEGATIVE mg/dL   Protein, ur 100 (A) NEGATIVE mg/dL   Nitrite POSITIVE (A) NEGATIVE   Leukocytes, UA NEGATIVE NEGATIVE  Urine microscopic-add on     Status: Abnormal   Collection Time: 06/03/15  8:24 PM  Result Value Ref Range   Squamous Epithelial / LPF 6-30 (A) NONE SEEN   WBC, UA 6-30 0 - 5 WBC/hpf   RBC / HPF 0-5 0 - 5 RBC/hpf   Bacteria, UA FEW (A) NONE SEEN   Sperm, UA PRESENT   Sodium, urine, random     Status: None   Collection Time: 06/04/15  1:28 AM  Result  Value Ref Range   Sodium, Ur 27 mmol/L    Comment: Performed at [facility]  Glucose, capillary     Status: Abnormal   Collection Time: 06/04/15  2:04 AM  Result Value Ref Range   Glucose-Capillary 244 (H) 65 - 99 mg/dL  Cortisol-am, blood     Status: None  Collection Time: 06/04/15  7:20 AM  Result Value Ref Range   Cortisol - AM 17.4 6.7 - 22.6 ug/dL    Comment: Performed at [facility]  Osmolality     Status: None   Collection Time: 06/04/15  7:20 AM  Result Value Ref Range   Osmolality 279 275 - 295 mOsm/kg    Comment: Performed at [facility]  CBC WITH DIFFERENTIAL     Status: Abnormal   Collection Time: 06/04/15  7:20 AM  Result Value Ref Range   WBC 7.5 4.0 - 10.5 K/uL   RBC 3.32 (L) 4.22 - 5.81 MIL/uL   Hemoglobin 9.7 (L) 13.0 - 17.0 g/dL   HCT 27.6 (L) 39.0 - 52.0 %   MCV 83.1 78.0 - 100.0 fL   MCH 29.2 26.0 - 34.0 pg   MCHC 35.1 30.0 - 36.0 g/dL   RDW 14.1 11.5 - 15.5 %   Platelets 239 150 - 400 K/uL   Neutrophils Relative % 69 %   Neutro Abs 5.1 1.7 - 7.7 K/uL   Lymphocytes Relative 19 %   Lymphs Abs 1.4 0.7 - 4.0 K/uL   Monocytes Relative 6 %   Monocytes Absolute 0.4 0.1 - 1.0 K/uL   Eosinophils Relative 6 %   Eosinophils Absolute 0.5 0.0 - 0.7 K/uL   Basophils Relative 0 %   Basophils Absolute 0.0 0.0 - 0.1 K/uL  Comprehensive metabolic panel     Status: Abnormal   Collection Time: 06/04/15  7:20 AM  Result Value Ref Range   Sodium 130 (L) 135 - 145 mmol/L   Potassium 4.8 3.5 - 5.1 mmol/L   Chloride 96 (L) 101 - 111 mmol/L   CO2 25 22 - 32 mmol/L   Glucose, Bld 198 (H) 65 - 99 mg/dL   BUN 18 6 - 20 mg/dL   Creatinine, Ser 0.92 0.61 - 1.24 mg/dL   Calcium 9.6 8.9 - 10.3 mg/dL   Total Protein 6.6 6.5 - 8.1 g/dL   Albumin 3.2 (L) 3.5 - 5.0 g/dL   AST 21 15 - 41 U/L   ALT 23 17 - 63 U/L   Alkaline Phosphatase 112 38 - 126 U/L   Total Bilirubin 0.5 0.3 - 1.2 mg/dL   GFR calc non Af Amer >60 >60 mL/min   GFR calc Af Amer >60 >60  mL/min    Comment: (NOTE) The eGFR has been calculated using the CKD EPI equation. This calculation has not been validated in all clinical situations. eGFR's persistently <60 mL/min signify possible Chronic Kidney Disease.    Anion gap 9 5 - 15  Glucose, capillary     Status: Abnormal   Collection Time: 06/04/15  7:49 AM  Result Value Ref Range   Glucose-Capillary 191 (H) 65 - 99 mg/dL  Glucose, capillary     Status: Abnormal   Collection Time: 06/04/15 11:54 AM  Result Value Ref Range   Glucose-Capillary 219 (H) 65 - 99 mg/dL  Magnesium     Status: Abnormal   Collection Time: 06/04/15 12:04 PM  Result Value Ref Range   Magnesium 1.5 (L) 1.7 - 2.4 mg/dL   No results found for this or any previous visit (from the past 240 hour(s)). Creatinine:  Recent Labs  06/01/15 2259 06/03/15 2005 06/04/15 0720  CREATININE 1.18 1.16 0.92   Impression/Assessment:  65 yo M with chronic prostatitis without evidence of being toxic or systemic infection and decreased UOP without associated retention. Decreased UOP appears to be  consistent with dehydration 2/2 diuretics and poor PO intake due to concern for worse LE edema.  Plan:  - Recommend 1 dose of ceftriaxone IV while hospitalized - Recommend discharge on 2 weeks of doxycycline for empiric treatment of prostatitis - Recommend discharge on 2-4 weeks of Mobic 7.5 mg daily for prostatic inflammation - patient reports he must check with his cariologist as to whether he can start this but would like a prescription for it - Recommend doubling dose of flomax to 0.4 mg BID given sensation of incomplete emptying - OK to discharge on pyridium for symptomatic relief of dysuria - Recommend increasing fluid intake - Recommend follow up with local urologist regarding chronic prostatitis - Recommend follow up with local cardiologist and nephrologist regarding worsening LE edema, low UOP and dehydration - Ok to d/c this afternoon so he can fly home  tomorrow AM - Urology will sign off  Acie Fredrickson 06/04/2015, 12:35 PM

## 2015-06-04 NOTE — Progress Notes (Addendum)
Brief pharmacy note:  Pharmacy consulted to dose heparin SQ for DVT prophylaxis, per pt he is not supposed to take enoxaparin because of "valve issues". Pt takes 81mg  aspirin daily, no other anticoagulation.  Plan: -Per RN pt did NOT receive enoxaparin dose this morning will start heparin 5000 units SQ q8h . -Pharmacy will sign off   Arley Phenixllen Jiro Kiester RPh 06/04/2015, 11:40 AM Pager [phone]

## 2015-06-05 LAB — URINE CULTURE: Culture: NO GROWTH

## 2015-06-05 LAB — ACTH: C206 ACTH: 40.9 pg/mL (ref 7.2–63.3)

## 2016-06-17 IMAGING — CT CT ABD-PELV W/ CM
2 of 5 series · 16 of 46 positions shown, 18 images · IV contrast (omnipaque)
Comparison: None.

CLINICAL DATA: Chronic generalized abdominal pain for 2 months,
with nausea, vomiting and diarrhea. Microhematuria. Initial
encounter.

EXAM:
CT ABDOMEN AND PELVIS WITH CONTRAST
TECHNIQUE: Multidetector CT imaging of the abdomen and pelvis was performed
using the standard protocol following bolus administration of
intravenous contrast.
CONTRAST:  100mL OMNIPAQUE IOHEXOL 300 MG/ML  SOLN

[Series 2: abd/pel with · axial · 0.88mm/px · z∈[+1177,+1612]mm · 13 of 99 slices shown, 15 images]
[im 6/99  soft-tissue]
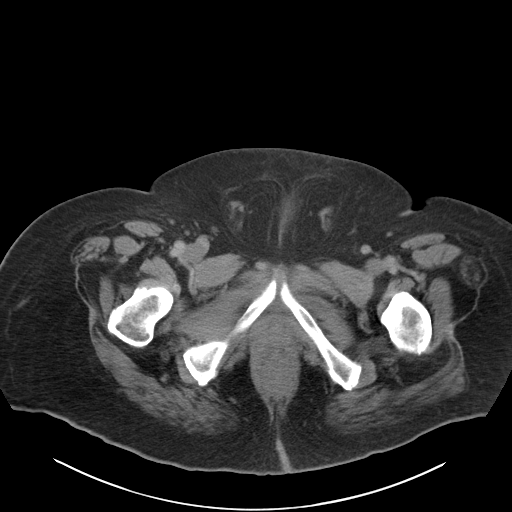
[im 6/99  bone]
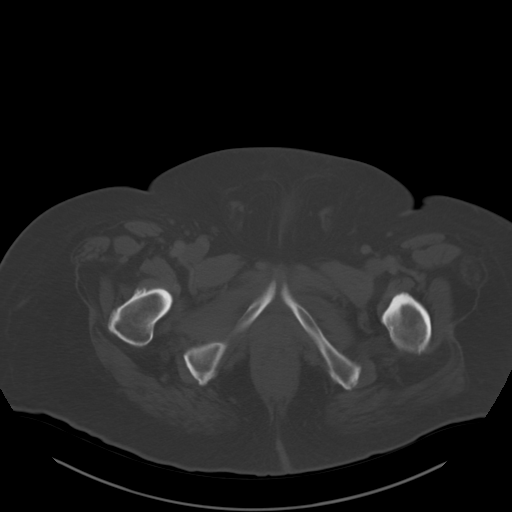
[im 16/99  soft-tissue]
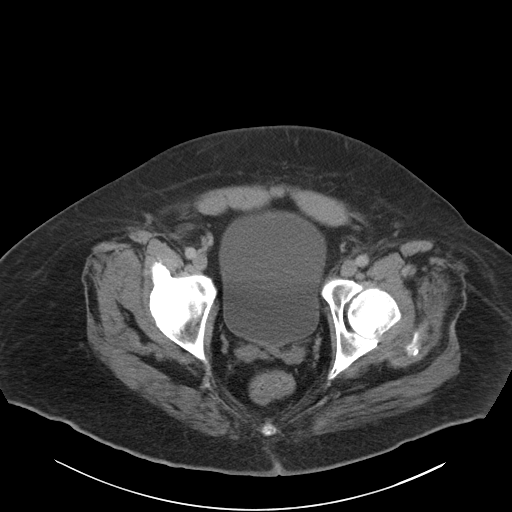
[im 21/99  soft-tissue]
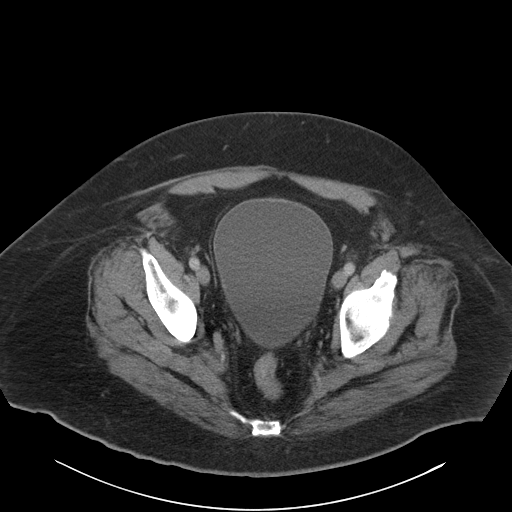
[im 26/99  soft-tissue]
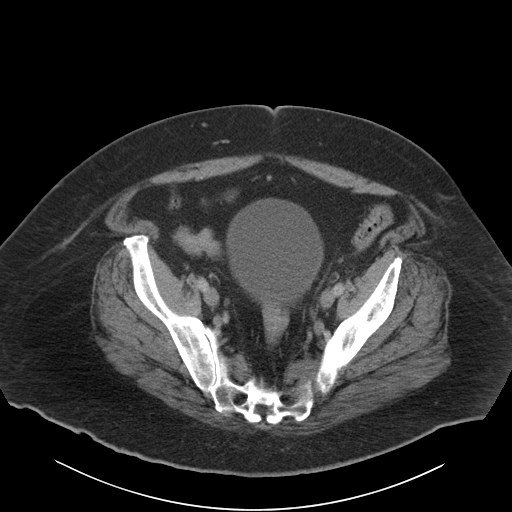
[im 37/99  soft-tissue]
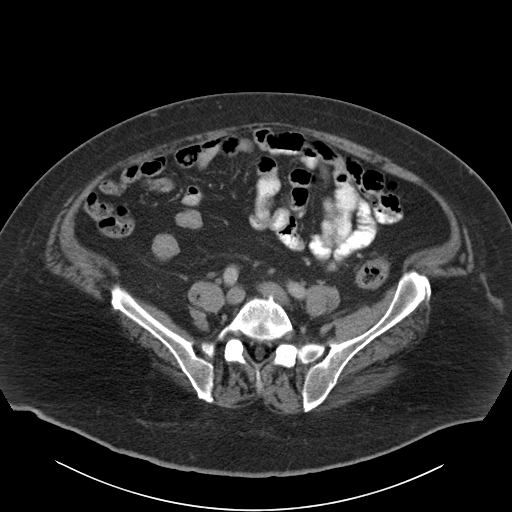
[im 42/99  soft-tissue]
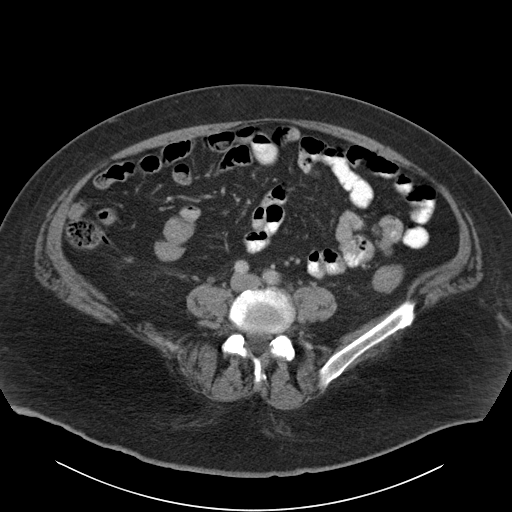
[im 52/99  soft-tissue]
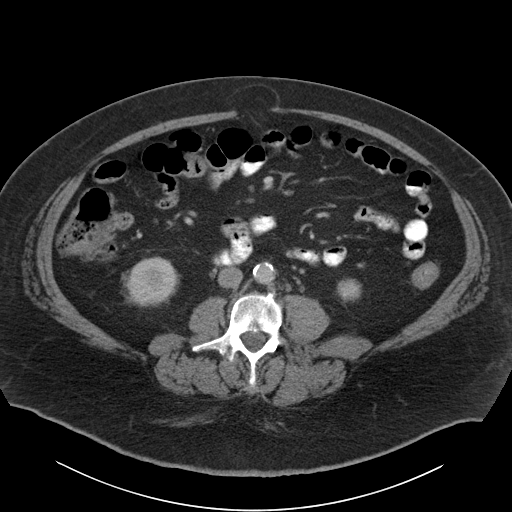
[im 57/99  soft-tissue]
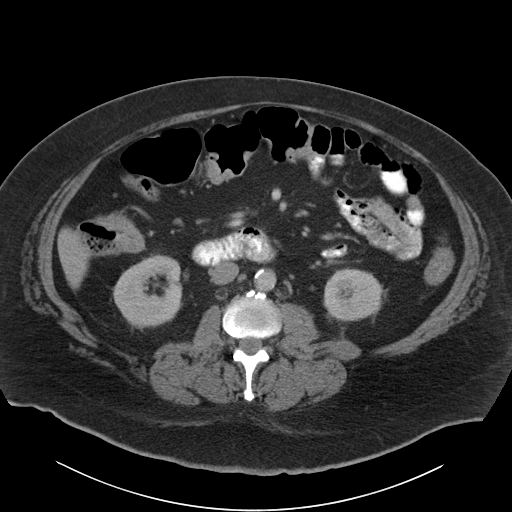
[im 62/99  soft-tissue]
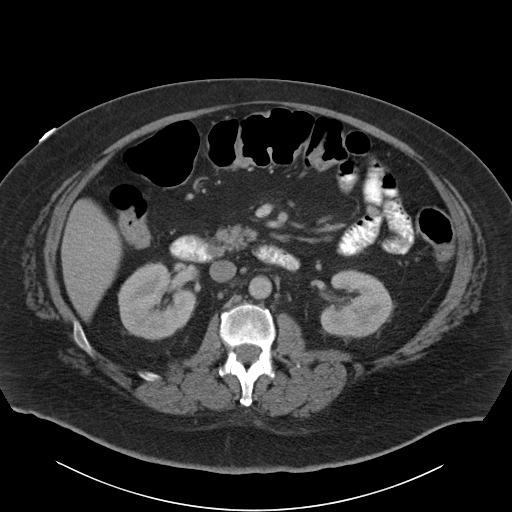
[im 62/99  bone]
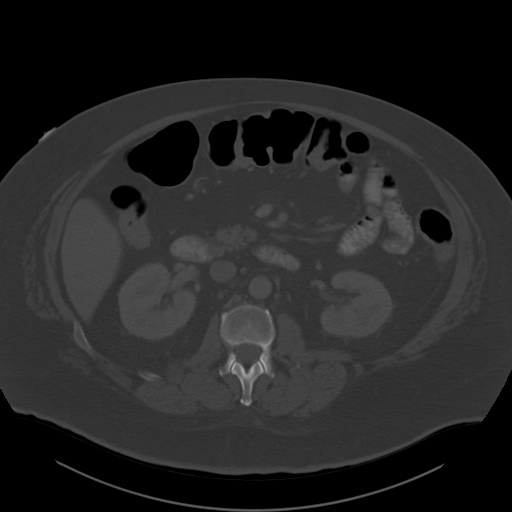
[im 73/99  soft-tissue]
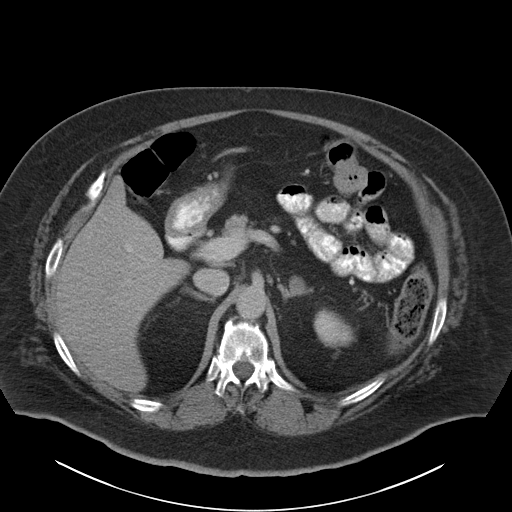
[im 78/99  soft-tissue]
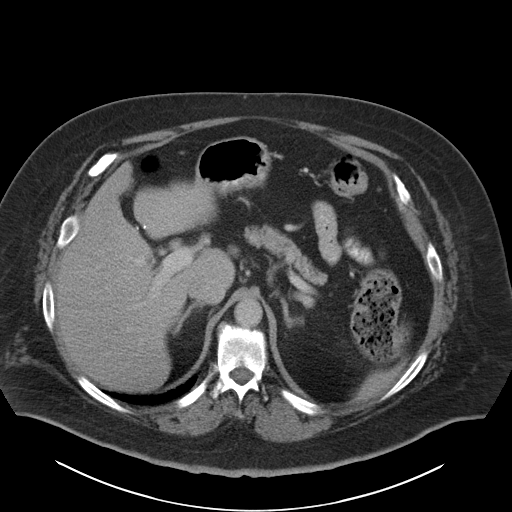
[im 83/99  soft-tissue]
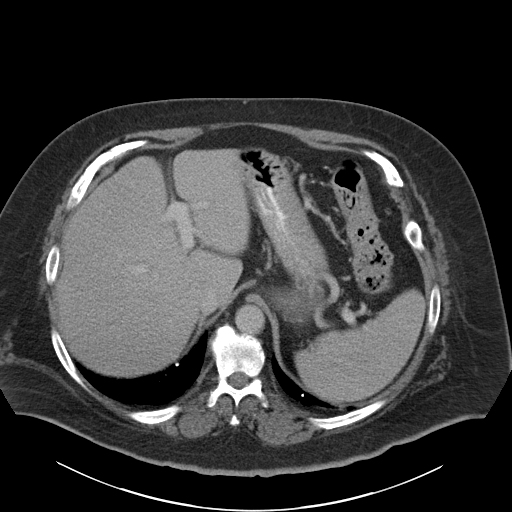
[im 93/99  soft-tissue]
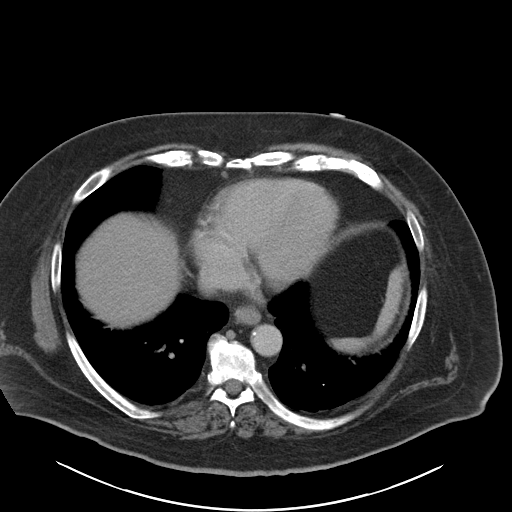

[Series 4: coronal a/|p · coronal · 0.85mm/px · 3 of 109 slices shown]
[im 37/109  soft-tissue]
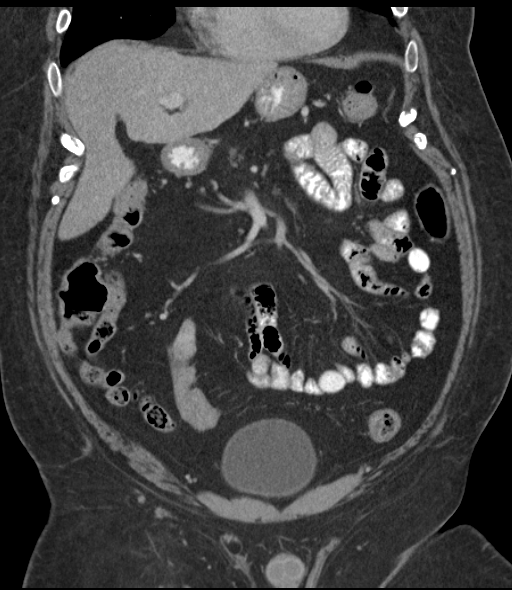
[im 49/109  soft-tissue]
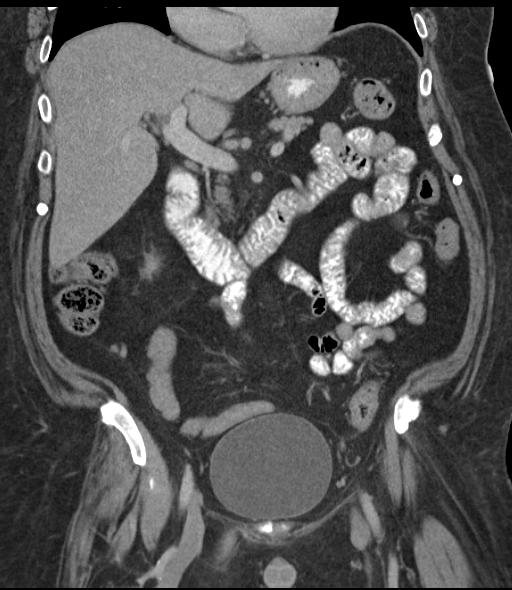
[im 61/109  soft-tissue]
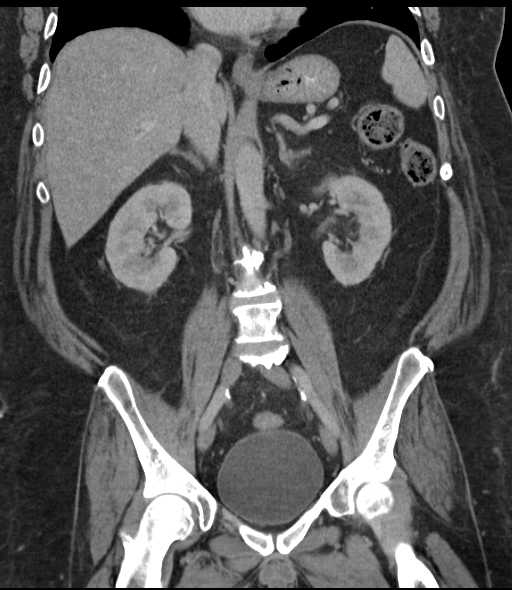

[16 of 46 positions shown; findings below may reference images not displayed]

FINDINGS: The visualized lung bases are clear. Scattered coronary artery
calcification is noted.

The liver and spleen are unremarkable in appearance. The patient is
status post cholecystectomy, with clips noted at the gallbladder
fossa. The pancreas and adrenal glands are unremarkable.

The kidneys are unremarkable in appearance. There is no evidence of
hydronephrosis. No renal or ureteral stones are seen. Mild
nonspecific perinephric stranding is noted bilaterally.

No free fluid is identified. The small bowel is unremarkable in
appearance. The stomach is within normal limits. No acute vascular
abnormalities are seen. Scattered calcification is noted along the
abdominal aorta and its branches.

A small midline anterior abdominal wall hernia is noted, superior to
the umbilicus, containing only fat.

The appendix is normal in caliber, without evidence for
appendicitis. The colon is unremarkable in appearance.

The bladder is moderately distended and grossly unremarkable. The
prostate remains borderline normal in size. No inguinal
lymphadenopathy is seen.

No acute osseous abnormalities are identified.
IMPRESSION: 1. No acute abnormality seen to explain the patient's symptoms.
2. Small midline anterior abdominal wall hernia, superior to the
umbilicus, containing only fat.
3. Scattered calcification along the abdominal aorta and its
branches.
4. Scattered coronary artery calcification noted.

## 2019-07-13 DEATH — deceased
